# Patient Record
Sex: Female | Born: 1970 | Race: Black or African American | Hispanic: No | Marital: Married | Smoking: Never smoker
Health system: Southern US, Community
[De-identification: ages and names within clinical notes are randomized; demographics above are authoritative.]

## PROBLEM LIST (undated history)

## (undated) DIAGNOSIS — D649 Anemia, unspecified: Secondary | ICD-10-CM

## (undated) DIAGNOSIS — M1990 Unspecified osteoarthritis, unspecified site: Secondary | ICD-10-CM

## (undated) DIAGNOSIS — M51369 Other intervertebral disc degeneration, lumbar region without mention of lumbar back pain or lower extremity pain: Secondary | ICD-10-CM

## (undated) DIAGNOSIS — K219 Gastro-esophageal reflux disease without esophagitis: Secondary | ICD-10-CM

## (undated) DIAGNOSIS — R7303 Prediabetes: Secondary | ICD-10-CM

## (undated) DIAGNOSIS — M5136 Other intervertebral disc degeneration, lumbar region: Secondary | ICD-10-CM

## (undated) HISTORY — PX: BREAST SURGERY: SHX581

## (undated) HISTORY — PX: TUBAL LIGATION: SHX77

---

## 1997-12-03 ENCOUNTER — Ambulatory Visit (HOSPITAL_COMMUNITY): Admission: RE | Admit: 1997-12-03 | Discharge: 1997-12-03 | Payer: Self-pay | Admitting: *Deleted

## 1997-12-24 ENCOUNTER — Ambulatory Visit (HOSPITAL_COMMUNITY): Admission: RE | Admit: 1997-12-24 | Discharge: 1997-12-24 | Payer: Self-pay | Admitting: *Deleted

## 1998-03-13 ENCOUNTER — Emergency Department (HOSPITAL_COMMUNITY): Admission: EM | Admit: 1998-03-13 | Discharge: 1998-03-13 | Payer: Self-pay | Admitting: Emergency Medicine

## 2000-06-05 ENCOUNTER — Other Ambulatory Visit: Admission: RE | Admit: 2000-06-05 | Discharge: 2000-06-05 | Payer: Self-pay | Admitting: Obstetrics and Gynecology

## 2002-03-27 ENCOUNTER — Other Ambulatory Visit: Admission: RE | Admit: 2002-03-27 | Discharge: 2002-03-27 | Payer: Self-pay | Admitting: Obstetrics and Gynecology

## 2002-07-09 HISTORY — PX: REDUCTION MAMMAPLASTY: SUR839

## 2002-07-24 ENCOUNTER — Observation Stay (HOSPITAL_COMMUNITY): Admission: EM | Admit: 2002-07-24 | Discharge: 2002-07-24 | Payer: Self-pay | Admitting: Plastic Surgery

## 2002-07-24 ENCOUNTER — Encounter (INDEPENDENT_AMBULATORY_CARE_PROVIDER_SITE_OTHER): Payer: Self-pay | Admitting: Specialist

## 2003-02-09 ENCOUNTER — Other Ambulatory Visit: Admission: RE | Admit: 2003-02-09 | Discharge: 2003-02-09 | Payer: Self-pay | Admitting: *Deleted

## 2003-12-03 ENCOUNTER — Emergency Department (HOSPITAL_COMMUNITY): Admission: EM | Admit: 2003-12-03 | Discharge: 2003-12-03 | Payer: Self-pay | Admitting: Emergency Medicine

## 2003-12-05 ENCOUNTER — Emergency Department (HOSPITAL_COMMUNITY): Admission: EM | Admit: 2003-12-05 | Discharge: 2003-12-05 | Payer: Self-pay | Admitting: *Deleted

## 2007-10-25 ENCOUNTER — Inpatient Hospital Stay (HOSPITAL_COMMUNITY): Admission: AD | Admit: 2007-10-25 | Discharge: 2007-10-25 | Payer: Self-pay | Admitting: Gynecology

## 2007-10-26 ENCOUNTER — Inpatient Hospital Stay (HOSPITAL_COMMUNITY): Admission: AD | Admit: 2007-10-26 | Discharge: 2007-10-26 | Payer: Self-pay | Admitting: Obstetrics & Gynecology

## 2007-10-27 ENCOUNTER — Emergency Department (HOSPITAL_COMMUNITY): Admission: EM | Admit: 2007-10-27 | Discharge: 2007-10-27 | Payer: Self-pay | Admitting: Emergency Medicine

## 2009-07-06 ENCOUNTER — Emergency Department (HOSPITAL_COMMUNITY): Admission: EM | Admit: 2009-07-06 | Discharge: 2009-07-06 | Payer: Self-pay | Admitting: Emergency Medicine

## 2010-07-30 ENCOUNTER — Encounter: Payer: Self-pay | Admitting: Gynecology

## 2010-11-24 NOTE — Op Note (Signed)
NAME:  Sandra Jenkins, Sandra Jenkins                      ACCOUNT NO.:  [id number]   MEDICAL RECORD NO.:  [id number]                   PATIENT TYPE:  AMB   LOCATION:  DSC                                  FACILITY:  [facility]   PHYSICIAN:  Brantley Persons, M.D.             DATE OF BIRTH:  1970/09/01   DATE OF PROCEDURE:  DATE OF DISCHARGE:                                 OPERATIVE REPORT   DATE OF BIRTH:  [birth year]   PREOPERATIVE DIAGNOSES:  1. Bilateral macromastia.  2. Bilateral accessory nipples.   POSTOPERATIVE DIAGNOSES:  1. Bilateral macromastia.  2. Bilateral accessory nipples.   PROCEDURE:  1. Bilateral reduction mammoplasties.  2. Excision of 2.5 cm right breast accessory nipple.  3. Excision of 4.5 cm left breast accessory nipple.   ATTENDING SURGEON:  Brantley Persons, M.D.   ANESTHESIA:  General endotracheal anesthesia.   ANESTHESIOLOGIST:  Janetta Hora. Gelene Mink, M.D.   ESTIMATED BLOOD LOSS:  225 cc.   FLUIDS REPLACED:  3500 cc crystalloid.   URINE OUTPUT:  250 cc.   COMPLICATIONS:  None.   INDICATIONS FOR PROCEDURE:  The patient is a 40 year-old African-American  female with bilateral macromastia that is becoming symptomatic.  She  complains of neck aches, upper backaches, headaches, shoulder strap groove  marks with pain and intertriginous skin rashes.  She presents to undergo  bilateral reduction mammoplasties.  Additionally, she has accessory nipples  present on both breasts.  The patient would like to have these accessory  nipples excised at the same time as the breast reduction surgery is  performed.  The indication for an overnight stay and progressive pain  control along with ambulation and monitoring of the nipples and the breast  flaps was discussed.   DESCRIPTION OF PROCEDURE:  The patient was marked in the preoperative  holding area in the pattern of a Wise for the future bilateral reduction  mammoplasties.  The areas of excision for the accessory  nipples were also  marked.  She was then taken back to the OR and placed on the table in the  supine position.  After general endotracheal anesthesia was obtained, the  patient's chest was prepped with Betadine and alcohol and draped in sterile  fashion.  The skin and subcutaneous tissues at the base of the breasts were  then injected with 1% lidocaine with epinephrine.  After adequate hemostasis  took effect the procedure was begun.  First the right breast reduction was  performed.  The nipple was marked with a 45 mm nipple marker.  The skin was  then incised around this and de-epithelialized down to the inframammary  crease and inferior pedicle pattern. Next the medial, superior and lateral  skin flaps were elevated down to the chest wall.  The excess fat and  glandular tissue were removed from the inferior pedicle.  The nipple was  then examined and found to be pink and viable.  The  accessory nipple was  also excised on the right breast.  The wound was then irrigated with saline  irrigation.  Meticulous hemostasis was obtained with the Bugbee  electrocautery.  The inferior pedicle was centralized using 3-0 Prolene  suture. A #10 Jackson-Pratt flat fully fluted drain was then placed into the  wound.  The skin flaps were brought together at the inverted T junction with  a 2-0 Prolene suture.  The incisions were then stapled for temporary  closure.   Attention was then turned to the left breast.  In a similar manner the  nipple was marked with a 45 mm nipple marker.  This was then incised and the  skin de-epithelialized around the nipple down to the inframammary crease and  an inferior pedicle pattern.  Next the medial, superior and lateral skin  flaps were elevated down to the chest wall.  The excess fat and glandular  tissue were removed from the inferior pedicle.  The nipple was then examined  and found to be pink and viable.  The accessory nipple was also excised from  the left  breast.  The wound was then irrigated with saline irrigation.  Meticulous hemostasis was obtained with the Bugbee electrocautery.  The  inferior pedicle was centralized using 3-0 Prolene suture. A #10 Jackson-  Pratt flat fully fluted drain was placed into the left breast as one had  been placed into the right breast.  The skin flaps were brought together in  the inverted T junction.  The skin incisions were then stapled for temporary  closure.   The breasts were then compared and found to have good shape and symmetry.  All the staples were removed and the incisions closed using 3-0 Monocryl on  the dermal layer followed by 4-0 Monocryl running intracuticular stitch on  the skin.  The patient was then placed in the upright position.  The future  location of the nipple areolar complexes were then marked on the breast  using the 42 mm nipple marker.  She was then placed back into the recumbent  position.   First the future nipple areolar complex on the right breast mound was  incised.  This tissue was excised full thickness.  The nipple was then  examined and found to be pink and viable and brought out into this aperture  and sewn in place using 4-0 Monocryl on the dermal layer followed by 5-0  Monocryl running intracuticular stitch on the skin.  In a likewise fashion,  the future nipple areolar complex was incised on the left breast mound.  This tissue was excised full thickness.  The nipple was examined and found  to be pink and viable and brought out into this aperture and sewn in place  using 4-0 Monocryl on the dermal layer followed by a 5-0 Monocryl running  intracuticular stitch on the skin.  The Jackson-Pratt drain was sewn in  place using 3-0 nylon suture.  The incisions were dressed with Benzoin and  Steri-Strips and the nipples additionally with Bacitracin ointment and Adaptic.  4 x 4's were then placed over the incisions.  The patient was  placed into a light postoperative  support bra.  There were no complications.  The patient tolerated the procedure well.  Final needle and sponge counts  are reported to be correct at the end of the case.  The patient was then  awakened from general anesthesia and taken to the recovery room in  stable condition.  She will remain  overnight in the RCC for progressive pain  control along with ambulation and monitoring of the nipples and breast  flaps.  Amounts of breast tissue removed:  Right breast - 708 grams; left  breast - 828 grams.                                                Brantley Persons, M.D.    MC/MEDQ  D:  07/23/2002  T:  07/23/2002  Job:  [id number]   cc:   Raynald Kemp, M.D.  [address]  [place]  [place] [address]  Fax: [phone]

## 2010-11-24 NOTE — Op Note (Signed)
NAME:  Sandra Jenkins, Sandra Jenkins                      ACCOUNT NO.:  [id number]   MEDICAL RECORD NO.:  [id number]                   PATIENT TYPE:  OBV   LOCATION:  5733                                 FACILITY:  [facility]   PHYSICIAN:  Brantley Persons, M.D.             DATE OF BIRTH:  1971/01/22   DATE OF PROCEDURE:  07/24/2002  DATE OF DISCHARGE:  07/24/2002                                 OPERATIVE REPORT   PREOPERATIVE DIAGNOSIS:  Left breast hematoma, status post bilateral  reduction mammoplasty.   POSTOPERATIVE DIAGNOSIS:  Left breast hematoma, status post bilateral  reduction mammoplasty.   PROCEDURE:  Exploration of the left breast with evacuation of left breast  hematoma and revisional left breast reduction mammoplasty.   SURGEON:  Brantley Persons, M.D.   ANESTHESIA:  General endotracheal.   ANESTHESIOLOGIST:  Sheldon Silvan, M.D.   ESTIMATED BLOOD LOSS:  150 cubic centimeters of clotted blood removed from  the breast.   FLUIDS REPLACED:  500 cubic centimeters.   COMPLICATIONS:  None.   INDICATION FOR PROCEDURE:  The patient underwent bilateral reduction  mammoplasty on 07/23/02.  Late on the evening of 07/23/02, a probable left  breast hematoma was evident on postoperative exam clinically.  She was  therefore brought to the operating room to undergo exploration of the left  breast with possible evacuation of left breast hematoma.   DESCRIPTION OF PROCEDURE:  The patient was brought in the OR and placed on  the table in supine position.  After adequate general anesthesia was  obtained, the chest was prepped with Betadine and draped in a sterile  fashion.  The left JP drain was removed.  All of the breast reduction  incisions were then opened up.  Upon doing this, it was evident that there  was a hematoma present into the left breast.  Approximately 150 cubic  centimeters of clotted blood was removed from the left breast.  Upon  examination of the breast tissue, there was a  bleeding vessel present along  the lateral aspect of the chest.  This was cauterized.  The rest of the  breast then was explored, and there were really only a few minimal oozing  sites that are normally present postoperatively with no further bleeders.  The wound was then irrigated with saline irrigation.  Meticulous hemostasis  was obtained with the Bovie electrocautery.  The wound was then watched for  approximately five to 10 minutes.  There was no further evidence of any  active bleeding present.  The wound was then once again closed.  A #10 JP  flat, ,fully-fluted drain was then placed into the wound.  The skin flaps  brought together at the inverted T junction with a 3-0 Monocryl suture in  the dermal layer.  The incisions were stapled for temporary closure.  The  incisions were then closed using 3-0 Monocryl in the dermal layer, followed  by 4-0 Monocryl running intracuticular stitch  on the skin.  The nipple was  then examined and found to be pink and viable.  It was then brought out into  its previous aperture and sewn in place using 4-0 Monocryl in the dermal  layer, followed by 5-0 Monocryl running intracuticular stitch on the skin.  The left JP drain was sewn in place using 3-0 nylon suture.  The incisions  on both were dressed with Benzoin and Steri-Strips with bacitracin ointment  and Adaptic additionally on the nipple.  Four by fours were then placed over  the incisions, and the patient was placed into a light postoperative support  bra.  There were no complications.  The patient tolerated the procedure  well.  The final needle and sponge counts were reported to be correct at the  end of the case.  The patient was then extubated and taken to the recovery  room in stable condition.  She was still to continue to remain as an  observation stay in the hospital system; however, she will remain in the  main hospital as opposed to going back to [facility] day surgery center.  Discharge is  planned for later this morning.  Although the patient was taken  back to the OR late on the evening of 07/23/02, the surgery was not completed  until the early morning hours of 07/24/02.                                               Brantley Persons, M.D.    MC/MEDQ  D:  07/24/2002  T:  07/26/2002  Job:  [id number]

## 2011-04-03 LAB — DIFFERENTIAL
Basophils Absolute: 0
Basophils Relative: 0
Eosinophils Absolute: 0.1
Eosinophils Relative: 1
Lymphocytes Relative: 33
Lymphs Abs: 2.8
Monocytes Absolute: 0.6
Monocytes Relative: 7
Neutro Abs: 5.1
Neutrophils Relative %: 59

## 2011-04-03 LAB — URINALYSIS, ROUTINE W REFLEX MICROSCOPIC
Nitrite: NEGATIVE
Specific Gravity, Urine: 1.01
pH: 6.5

## 2011-04-03 LAB — WET PREP, GENITAL
Clue Cells Wet Prep HPF POC: NONE SEEN
Trich, Wet Prep: NONE SEEN
Yeast Wet Prep HPF POC: NONE SEEN

## 2011-04-03 LAB — GC/CHLAMYDIA PROBE AMP, GENITAL
Chlamydia, DNA Probe: NEGATIVE
GC Probe Amp, Genital: NEGATIVE

## 2011-04-03 LAB — CBC
Hemoglobin: 12
MCHC: 33.5
Platelets: 264
RDW: 15.5

## 2011-04-03 LAB — COMPREHENSIVE METABOLIC PANEL WITH GFR
ALT: 15
AST: 15
Albumin: 3.5
Alkaline Phosphatase: 81
BUN: 10
CO2: 31
Calcium: 9.1
Chloride: 106
Creatinine, Ser: 0.87
GFR calc non Af Amer: >60
Glucose, Bld: 94
Potassium: 4.3
Sodium: 140
Total Bilirubin: 0.3
Total Protein: 6.8

## 2011-04-03 LAB — POCT H PYLORI SCREEN: H. PYLORI SCREEN, POC: NEGATIVE

## 2011-04-03 LAB — AMYLASE: Amylase: 177 — ABNORMAL HIGH

## 2011-04-03 LAB — POCT PREGNANCY, URINE
Operator id: 183839
Preg Test, Ur: NEGATIVE

## 2011-11-08 ENCOUNTER — Other Ambulatory Visit: Payer: Self-pay | Admitting: Obstetrics and Gynecology

## 2011-11-08 DIAGNOSIS — Z1231 Encounter for screening mammogram for malignant neoplasm of breast: Secondary | ICD-10-CM

## 2011-11-12 ENCOUNTER — Ambulatory Visit
Admission: RE | Admit: 2011-11-12 | Discharge: 2011-11-12 | Disposition: A | Discharge status: Home / Self Care | Payer: 59 | Source: Ambulatory Visit | Attending: Obstetrics and Gynecology | Admitting: Obstetrics and Gynecology

## 2011-11-12 DIAGNOSIS — Z1231 Encounter for screening mammogram for malignant neoplasm of breast: Secondary | ICD-10-CM

## 2011-12-25 ENCOUNTER — Telehealth: Payer: Self-pay | Admitting: Obstetrics and Gynecology

## 2011-12-25 NOTE — Telephone Encounter (Signed)
New pt/no cht

## 2012-01-30 ENCOUNTER — Encounter: Payer: Self-pay | Admitting: Obstetrics and Gynecology

## 2012-02-04 ENCOUNTER — Encounter: Payer: Self-pay | Admitting: Obstetrics and Gynecology

## 2012-08-22 ENCOUNTER — Encounter: Payer: Self-pay | Admitting: Obstetrics and Gynecology

## 2012-09-28 ENCOUNTER — Ambulatory Visit (INDEPENDENT_AMBULATORY_CARE_PROVIDER_SITE_OTHER): Payer: 59 | Admitting: Physician Assistant

## 2012-09-28 VITALS — BP 106/75 | HR 72 | Temp 97.9°F | Resp 15 | Ht 64.0 in | Wt 269.0 lb

## 2012-09-28 DIAGNOSIS — J069 Acute upper respiratory infection, unspecified: Secondary | ICD-10-CM

## 2012-09-28 MED ORDER — CETIRIZINE HCL 10 MG PO TABS: 10.0000 mg | ORAL_TABLET | Freq: Every day | ORAL | Status: DC

## 2012-09-28 MED ORDER — IPRATROPIUM BROMIDE 0.03 % NA SOLN: 2.0000 | Freq: Two times a day (BID) | NASAL | Status: DC

## 2012-09-28 NOTE — Patient Instructions (Addendum)
Begin taking the Zyrtec (cetirizine) once daily.  Also begin using the Atrovent nasal spray twice daily.  Both of these will help with the runny nose and sneezing.  Plenty of fluids and rest.  Please let me know if anything is worsening or not improving, of if new symptoms develop.  You can continue taking the Zyrtec daily if you feel like you are getting benefit from it   Upper Respiratory Infection, Adult An upper respiratory infection (URI) is also sometimes known as the common cold. The upper respiratory tract includes the nose, sinuses, throat, trachea, and bronchi. Bronchi are the airways leading to the lungs. Most people improve within 1 week, but symptoms can last up to 2 weeks. A residual cough may last even longer.  CAUSES Many different viruses can infect the tissues lining the upper respiratory tract. The tissues become irritated and inflamed and often become very moist. Mucus production is also common. A cold is contagious. You can easily spread the virus to others by oral contact. This includes kissing, sharing a glass, coughing, or sneezing. Touching your mouth or nose and then touching a surface, which is then touched by another person, can also spread the virus. SYMPTOMS  Symptoms typically develop 1 to 3 days after you come in contact with a cold virus. Symptoms vary from person to person. They may include:  Runny nose.  Sneezing.  Nasal congestion.  Sinus irritation.  Sore throat.  Loss of voice (laryngitis).  Cough.  Fatigue.  Muscle aches.  Loss of appetite.  Headache.  Low-grade fever. DIAGNOSIS  You might diagnose your own cold based on familiar symptoms, since most people get a cold 2 to 3 times a year. Your caregiver can confirm this based on your exam. Most importantly, your caregiver can check that your symptoms are not due to another disease such as strep throat, sinusitis, pneumonia, asthma, or epiglottitis. Blood tests, throat tests, and X-rays are  not necessary to diagnose a common cold, but they may sometimes be helpful in excluding other more serious diseases. Your caregiver will decide if any further tests are required. RISKS AND COMPLICATIONS  You may be at risk for a more severe case of the common cold if you smoke cigarettes, have chronic heart disease (such as heart failure) or lung disease (such as asthma), or if you have a weakened immune system. The very young and very old are also at risk for more serious infections. Bacterial sinusitis, middle ear infections, and bacterial pneumonia can complicate the common cold. The common cold can worsen asthma and chronic obstructive pulmonary disease (COPD). Sometimes, these complications can require emergency medical care and may be life-threatening. PREVENTION  The best way to protect against getting a cold is to practice good hygiene. Avoid oral or hand contact with people with cold symptoms. Wash your hands often if contact occurs. There is no clear evidence that vitamin C, vitamin E, echinacea, or exercise reduces the chance of developing a cold. However, it is always recommended to get plenty of rest and practice good nutrition. TREATMENT  Treatment is directed at relieving symptoms. There is no cure. Antibiotics are not effective, because the infection is caused by a virus, not by bacteria. Treatment may include:  Increased fluid intake. Sports drinks offer valuable electrolytes, sugars, and fluids.  Breathing heated mist or steam (vaporizer or shower).  Eating chicken soup or other clear broths, and maintaining good nutrition.  Getting plenty of rest.  Using gargles or lozenges for comfort.  Controlling fevers with ibuprofen or acetaminophen as directed by your caregiver.  Increasing usage of your inhaler if you have asthma. Zinc gel and zinc lozenges, taken in the first 24 hours of the common cold, can shorten the duration and lessen the severity of symptoms. Pain medicines may  help with fever, muscle aches, and throat pain. A variety of non-prescription medicines are available to treat congestion and runny nose. Your caregiver can make recommendations and may suggest nasal or lung inhalers for other symptoms.  HOME CARE INSTRUCTIONS   Only take over-the-counter or prescription medicines for pain, discomfort, or fever as directed by your caregiver.  Use a warm mist humidifier or inhale steam from a shower to increase air moisture. This may keep secretions moist and make it easier to breathe.  Drink enough water and fluids to keep your urine clear or pale yellow.  Rest as needed.  Return to work when your temperature has returned to normal or as your caregiver advises. You may need to stay home longer to avoid infecting others. You can also use a face mask and careful hand washing to prevent spread of the virus. SEEK MEDICAL CARE IF:   After the first few days, you feel you are getting worse rather than better.  You need your caregiver's advice about medicines to control symptoms.  You develop chills, worsening shortness of breath, or brown or red sputum. These may be signs of pneumonia.  You develop yellow or brown nasal discharge or pain in the face, especially when you bend forward. These may be signs of sinusitis.  You develop a fever, swollen neck glands, pain with swallowing, or white areas in the back of your throat. These may be signs of strep throat. SEEK IMMEDIATE MEDICAL CARE IF:   You have a fever.  You develop severe or persistent headache, ear pain, sinus pain, or chest pain.  You develop wheezing, a prolonged cough, cough up blood, or have a change in your usual mucus (if you have chronic lung disease).  You develop sore muscles or a stiff neck. Document Released: 12/19/2000 Document Revised: 09/17/2011 Document Reviewed: 10/27/2010 [facility] Patient Information 2013 [place], [place].

## 2012-09-28 NOTE — Progress Notes (Signed)
  Subjective:    Patient ID: Sandra Jenkins, female    DOB: 1971-06-20, 42 y.o.   MRN: [id number]  HPI   Sandra Jenkins is a very pleasant 42 yr old female here with concern for illness.  Has had about 2-3 days of nasal congestion, rhinorrhea, watery eyes, and scratchy throat.  Some non-productive cough as well.  No ear pain or HA.  No SOB or wheezing.  Chills but no fever.  No GI symptoms.  Husband was recently sick with similar symptoms.  Denies environmental allergies stating she's never been tested for this.  Has used alka seltzer for symptoms with little relief.     Review of Systems  Constitutional: Positive for chills. Negative for fever.  HENT: Positive for congestion, sore throat and rhinorrhea. Negative for ear pain and postnasal drip.   Eyes: Positive for discharge (watery).  Respiratory: Positive for cough. Negative for shortness of breath and wheezing.   Cardiovascular: Negative.   Gastrointestinal: Negative.   Musculoskeletal: Negative.   Skin: Negative.   Allergic/Immunologic: Negative for environmental allergies.  Neurological: Negative.        Objective:   Physical Exam  Vitals reviewed. Constitutional: She is oriented to person, place, and time. She appears well-developed and well-nourished. No distress.  HENT:  Head: Normocephalic and atraumatic.  Right Ear: Tympanic membrane and ear canal normal.  Left Ear: Tympanic membrane and ear canal normal.  Nose: Mucosal edema and rhinorrhea present. Right sinus exhibits no maxillary sinus tenderness and no frontal sinus tenderness. Left sinus exhibits no maxillary sinus tenderness and no frontal sinus tenderness.  Mouth/Throat: Uvula is midline, oropharynx is clear and moist and mucous membranes are normal.  Eyes: Conjunctivae are normal. No scleral icterus.  Neck: Neck supple.  Cardiovascular: Normal rate, regular rhythm and normal heart sounds.   Pulmonary/Chest: Effort normal and breath sounds normal. She has no  wheezes. She has no rales.  Lymphadenopathy:    She has no cervical adenopathy.  Neurological: She is alert and oriented to person, place, and time.  Skin: Skin is warm and dry.  Psychiatric: She has a normal mood and affect. Her behavior is normal.     Filed Vitals:   09/28/12 1012  BP: 106/75  Pulse: 72  Temp: 97.9 F (36.6 C)  Resp: 15        Assessment & Plan:  Acute upper respiratory infections of unspecified site - Plan: cetirizine (ZYRTEC) 10 MG tablet, ipratropium (ATROVENT) 0.03 % nasal spray  Sandra Jenkins is a very pleasant 42 yr old female here with 2-3 days of URI symptoms.  Nasal congestion, rhinorrhea are the most troubling symptoms.  Afebrile, VSS, lungs CTA.  Will treat symptoms with Zyrtec daily and Atrovent nasal BID.  Push fluids, plenty of rest.  If worsening or not improving, pt will RTC.

## 2013-05-06 ENCOUNTER — Other Ambulatory Visit: Payer: Self-pay

## 2013-05-06 DIAGNOSIS — Z1231 Encounter for screening mammogram for malignant neoplasm of breast: Secondary | ICD-10-CM

## 2013-06-19 ENCOUNTER — Ambulatory Visit: Payer: 59

## 2013-06-22 ENCOUNTER — Ambulatory Visit (INDEPENDENT_AMBULATORY_CARE_PROVIDER_SITE_OTHER): Payer: 59 | Admitting: Family Medicine

## 2013-06-22 VITALS — BP 108/80 | HR 109 | Temp 100.2°F | Resp 16 | Ht 64.0 in | Wt 235.0 lb

## 2013-06-22 DIAGNOSIS — J029 Acute pharyngitis, unspecified: Secondary | ICD-10-CM

## 2013-06-22 DIAGNOSIS — J020 Streptococcal pharyngitis: Secondary | ICD-10-CM

## 2013-06-22 DIAGNOSIS — R509 Fever, unspecified: Secondary | ICD-10-CM

## 2013-06-22 LAB — POCT INFLUENZA A/B
Influenza A, POC: NEGATIVE
Influenza B, POC: NEGATIVE

## 2013-06-22 MED ORDER — HYDROCODONE-ACETAMINOPHEN 7.5-325 MG/15ML PO SOLN: 10.0000 mL | Freq: Four times a day (QID) | ORAL | Status: DC | PRN

## 2013-06-22 MED ORDER — AMOXICILLIN 875 MG PO TABS: 875.0000 mg | ORAL_TABLET | Freq: Two times a day (BID) | ORAL | Status: DC

## 2013-06-22 MED ORDER — FIRST-DUKES MOUTHWASH MT SUSP: 5.0000 mL | OROMUCOSAL | Status: DC | PRN

## 2013-06-22 NOTE — Progress Notes (Addendum)
This chart was scribed for Norberto Sorenson, MD by Joaquin Music, ED Scribe. This patient was seen in room Room/bed 13 and the patient's care was started at 10:02 AM. Subjective:    Patient ID: Sandra Jenkins, female    DOB: 04-01-1971, 42 y.o.   MRN: [id number] Chief Complaint  Patient presents with  . Sore Throat    all symptoms since Friday  . Cough  . Chills    HPI Sandra Jenkins is a 42 y.o. female who presents to the [facility] complaining of ongoing worsening sore throat, cough, subj fever, chills, Rt ear pain, HA, myalgias and voice change that began 3 days ago. Pt reports having cough prod of green/yellow sputum and same when blowing her nose. She states she has taken Tylenol Cold, BC powders, and throat lozenges w/o relief and denies taking any medications today. She states her god-daughter has strep throat currently but denies any other sick contacts. No GI/GU sxs.   History reviewed. No pertinent past medical history.  No Known Allergies  Current outpatient prescriptions:Phenylephrine-DM-GG-APAP (TYLENOL COLD/FLU SEVERE PO), Take by mouth as needed., Disp: , Rfl: ;  cetirizine (ZYRTEC) 10 MG tablet, Take 1 tablet (10 mg total) by mouth daily., Disp: 30 tablet, Rfl: 11;  ipratropium (ATROVENT) 0.03 % nasal spray, Place 2 sprays into the nose 2 (two) times daily., Disp: 30 mL, Rfl: 1  Review of Systems  Constitutional: Positive for fever, chills, diaphoresis, activity change, appetite change and fatigue. Negative for unexpected weight change.  HENT: Positive for congestion, ear pain, postnasal drip, rhinorrhea, sore throat, trouble swallowing and voice change.   Respiratory: Positive for cough. Negative for choking, shortness of breath, wheezing and stridor.   Cardiovascular: Negative for chest pain.  Gastrointestinal: Negative for nausea and diarrhea.  Musculoskeletal: Positive for myalgias.  Skin: Negative for rash.  Neurological: Positive for headaches.  Hematological:  Positive for adenopathy.  Psychiatric/Behavioral: Positive for sleep disturbance.    Triage Vitals:BP 108/80  Pulse 109  Temp(Src) 100.2 F (37.9 C) (Oral)  Resp 16  Ht 5\' 4"  (1.626 m)  Wt 235 lb (106.595 kg)  BMI 40.32 kg/m2  SpO2 95%  LMP 06/01/2013 Objective:   Physical Exam  Nursing note and vitals reviewed. Constitutional: She is oriented to person, place, and time. She appears well-developed and well-nourished. No distress.  HENT:  Head: Normocephalic and atraumatic.  Right Ear: External ear normal.  Left Ear: Tympanic membrane, external ear and ear canal normal.  Nose: Mucosal edema and rhinorrhea present.  Mouth/Throat: Uvula is midline. Mucous membranes are dry. No trismus in the jaw. No uvula swelling. Oropharyngeal exudate, posterior oropharyngeal edema and posterior oropharyngeal erythema present. No tonsillar abscesses.  R ear occluded with cerumen. Nasal mucosal edema and rhinorrhea Rt worse then Lt. Tonsils at 3+ bilaterally. Bright beet red erythema.    Eyes: Pupils are equal, round, and reactive to light.  Neck: Normal range of motion. Neck supple. No rigidity. Normal range of motion present. No mass and no thyromegaly present.  Cardiovascular: Normal rate and regular rhythm.   Pulmonary/Chest: Effort normal. No respiratory distress. She has no decreased breath sounds. She has no wheezes. She has no rales.  Bibasilar expiratory rhonchi that cleared with repeated inhalations.   Musculoskeletal: Normal range of motion.  Lymphadenopathy:       Head (right side): Submandibular and tonsillar adenopathy present. No preauricular and no posterior auricular adenopathy present.       Head (left side): Submandibular and tonsillar adenopathy present.  No preauricular and no posterior auricular adenopathy present.       Right cervical: No posterior cervical adenopathy present.      Left cervical: No superficial cervical and no posterior cervical adenopathy present.       Right:  No supraclavicular adenopathy present.       Left: No supraclavicular adenopathy present.  Neurological: She is alert and oriented to person, place, and time.  Skin: Skin is warm and dry.  Psychiatric: She has a normal mood and affect. Her behavior is normal.   Results for orders placed in visit on 06/22/13  POCT RAPID STREP A (OFFICE)      Result Value Range   Rapid Strep A Screen Positive (*) Negative  POCT INFLUENZA A/B      Result Value Range   Influenza A, POC Negative     Influenza B, POC Negative     Assessment & Plan:  Streptococcal sore throat - warned of complications if sxs worsen at all - pt does have mild "hot potato voice" on exam today so if worsening at all, if any trouble swallowing medication, RTC immed - pt understands and is agreeable.  Sore throat - Plan: POCT rapid strep A, POCT Influenza A/B  Fever - Plan: POCT Influenza A/B  Meds ordered this encounter  Medications  . DISCONTD: Phenylephrine-DM-GG-APAP (TYLENOL COLD/FLU SEVERE PO)    Sig: Take by mouth as needed.  Marland Kitchen HYDROcodone-acetaminophen (HYCET) 7.5-325 mg/15 ml solution    Sig: Take 10-15 mLs by mouth every 6 (six) hours as needed.    Dispense:  140 mL    Refill:  0  . Diphenhyd-Hydrocort-Nystatin (FIRST-DUKES MOUTHWASH) SUSP    Sig: Use as directed 5 mLs in the mouth or throat every 2 (two) hours as needed (sore throat). Mix 2:1 with dukes:lidocaine    Dispense:  237 mL    Refill:  0  . amoxicillin (AMOXIL) 875 MG tablet    Sig: Take 1 tablet (875 mg total) by mouth 2 (two) times daily.    Dispense:  20 tablet    Refill:  0    I personally performed the services described in this documentation, which was scribed in my presence. The recorded information has been reviewed and considered, and addended by me as needed.  Norberto Sorenson, MD MPH

## 2013-06-22 NOTE — Patient Instructions (Signed)
Strep Throat  Strep throat is an infection of the throat caused by a bacteria named Streptococcus pyogenes. Your caregiver may call the infection streptococcal "tonsillitis" or "pharyngitis" depending on whether there are signs of inflammation in the tonsils or back of the throat. Strep throat is most common in children aged 43 15 years during the cold months of the year, but it can occur in people of any age during any season. This infection is spread from person to person (contagious) through coughing, sneezing, or other close contact.  SYMPTOMS   · Fever or chills.  · Painful, swollen, red tonsils or throat.  · Pain or difficulty when swallowing.  · White or yellow spots on the tonsils or throat.  · Swollen, tender lymph nodes or "glands" of the neck or under the jaw.  · Red rash all over the body (rare).  DIAGNOSIS   Many different infections can cause the same symptoms. A test must be done to confirm the diagnosis so the right treatment can be given. A "rapid strep test" can help your caregiver make the diagnosis in a few minutes. If this test is not available, a light swab of the infected area can be used for a throat culture test. If a throat culture test is done, results are usually available in a day or two.  TREATMENT   Strep throat is treated with antibiotic medicine.  HOME CARE INSTRUCTIONS   · Gargle with 1 tsp of salt in 1 cup of warm water, 3 4 times per day or as needed for comfort.  · Family members who also have a sore throat or fever should be tested for strep throat and treated with antibiotics if they have the strep infection.  · Make sure everyone in your household washes their hands well.  · Do not share food, drinking cups, or personal items that could cause the infection to spread to others.  · You may need to eat a soft food diet until your sore throat gets better.  · Drink enough water and fluids to keep your urine clear or pale yellow. This will help prevent dehydration.  · Get plenty of  rest.  · Stay home from school, daycare, or work until you have been on antibiotics for 24 hours.  · Only take over-the-counter or prescription medicines for pain, discomfort, or fever as directed by your caregiver.  · If antibiotics are prescribed, take them as directed. Finish them even if you start to feel better.  SEEK MEDICAL CARE IF:   · The glands in your neck continue to enlarge.  · You develop a rash, cough, or earache.  · You cough up green, yellow-brown, or bloody sputum.  · You have pain or discomfort not controlled by medicines.  · Your problems seem to be getting worse rather than better.  SEEK IMMEDIATE MEDICAL CARE IF:   · You develop any new symptoms such as vomiting, severe headache, stiff or painful neck, chest pain, shortness of breath, or trouble swallowing.  · You develop severe throat pain, drooling, or changes in your voice.  · You develop swelling of the neck, or the skin on the neck becomes red and tender.  · You have a fever.  · You develop signs of dehydration, such as fatigue, dry mouth, and decreased urination.  · You become increasingly sleepy, or you cannot wake up completely.  Document Released: 06/22/2000 Document Revised: 06/11/2012 Document Reviewed: 08/24/2010  Exit[employer]® Patient Information ©2014 [employer].

## 2013-08-07 ENCOUNTER — Other Ambulatory Visit (INDEPENDENT_AMBULATORY_CARE_PROVIDER_SITE_OTHER): Payer: Self-pay | Admitting: General Surgery

## 2013-08-07 ENCOUNTER — Encounter (INDEPENDENT_AMBULATORY_CARE_PROVIDER_SITE_OTHER): Payer: Self-pay | Admitting: General Surgery

## 2013-08-07 ENCOUNTER — Ambulatory Visit (INDEPENDENT_AMBULATORY_CARE_PROVIDER_SITE_OTHER): Payer: 59 | Admitting: General Surgery

## 2013-08-07 DIAGNOSIS — Z6841 Body Mass Index (BMI) 40.0 and over, adult: Secondary | ICD-10-CM

## 2013-08-07 DIAGNOSIS — M2550 Pain in unspecified joint: Secondary | ICD-10-CM

## 2013-08-07 NOTE — Progress Notes (Signed)
Subjective:   obesity  Patient ID: Sandra Jenkins, female   DOB: February 12, 1971, 43 y.o.   MRN: [id number]  HPI Patient is a pleasant 43 year old female referred by Dr. Delman Cheadle for consideration for surgical treatment for morbid obesity. The patient states that she was relatively normal weight throughout young adulthood but once she began to have her children she experienced progressive weight gain after pregnancies. She has been through multiple efforts at diet and exercise and medical treatment for her obesity and has been able to lose up to 40 pounds at a time particularly with medications but then experiences progressive weight gain. She is having significant pain in her knees and has been told she will need surgery on her right knee but that it would be difficult that her current weight. She finds it difficult to do any sort of exercise or participate in her routine activities due to her joint pain. She has avoided any other serious illness today but is concerned about her long-term health going forward and her current weight.  History reviewed. No pertinent past medical history. Past Surgical History  Procedure Laterality Date  . Breast surgery    . Cesarean section    . Tubal ligation     Current Outpatient Prescriptions  Medication Sig Dispense Refill  . amoxicillin (AMOXIL) 875 MG tablet Take 1 tablet (875 mg total) by mouth 2 (two) times daily.  20 tablet  0  . cetirizine (ZYRTEC) 10 MG tablet Take 1 tablet (10 mg total) by mouth daily.  30 tablet  11  . Diphenhyd-Hydrocort-Nystatin (FIRST-DUKES MOUTHWASH) SUSP Use as directed 5 mLs in the mouth or throat every 2 (two) hours as needed (sore throat). Mix 2:1 with dukes:lidocaine  237 mL  0  . HYDROcodone-acetaminophen (HYCET) 7.5-325 mg/15 ml solution Take 10-15 mLs by mouth every 6 (six) hours as needed.  140 mL  0  . ipratropium (ATROVENT) 0.03 % nasal spray Place 2 sprays into the nose 2 (two) times daily.  30 mL  1   No current  facility-administered medications for this visit.   No Known Allergies History  Substance Use Topics  . Smoking status: Never Smoker   . Smokeless tobacco: Never Used  . Alcohol Use: Yes     Comment: 2/month     Review of Systems General: No fever chills malaise HEENT: The vision hearing swallowing problems Respiratory denies cough or shortness of breath or wheezing Cardiac denies chest pain palpitations or leg swelling Abdomen: Mild occasional reflux controlled with over-the-counter medications. Some tendency toward constipation. Breast: Mammogram up-to-date no masses GU: History of heavy menses and anemia no urinary difficulties Extremities: Chronic knee pain    Objective:   Physical Exam BP 131/77  Pulse 83  Temp(Src) 97.4 F (36.3 C) (Temporal)  Resp 16  Ht 5\' 4"  (1.626 m)  Wt 274 lb 6.4 oz (124.467 kg)  BMI 47.08 kg/m2 General: Alert, obese African American female, in no distress Skin: Warm and dry without rash or infection. HEENT: No palpable masses or thyromegaly. Sclera nonicteric. Pupils equal round and reactive. Oropharynx clear. Lymph nodes: No cervical, supraclavicular, or inguinal nodes palpable. Lungs: Breath sounds clear and equal without increased work of breathing Cardiovascular: Regular rate and rhythm without murmur. No JVD or edema. Peripheral pulses intact. Abdomen: Nondistended. Soft and nontender. No masses palpable. No organomegaly. No palpable hernias. Extremities: No edema or joint swelling or deformity. No chronic venous stasis changes. Neurologic: Alert and fully oriented. Gait normal.  Assessment:     43 year old female with progressive morbid obesity unresponsive to multiple attempts at medical management who presents at a BMI of 47 and comorbidities of chronic joint pain. The patient has significant medical benefit if we get her down to a healthier weight. We reviewed in detail the surgical options available including lap band, sleeve  gastrectomy and gastric bypass. She would prefer the sleeve gastrectomy due to the reliability of weight loss and less complex nature of the surgery. I think this would be a good choice for her. We reviewed the nature of the surgery and recovery and detail. We discussed risks including anesthetic complications, bleeding, leakage and infection, pulmonary embolus and risk of death. We discussed long-term problems including reflux, stenosis, poor gastric emptying, gallstones and others. She was given a complete consent form to review. Prohibit preop psychologic and nutrition evaluation as well as upper GI series and routine blood work. He'll see her back following this workup.    Plan:     Proceed with workup for planned laparoscopic sleeve gastrectomy

## 2013-08-14 ENCOUNTER — Other Ambulatory Visit (INDEPENDENT_AMBULATORY_CARE_PROVIDER_SITE_OTHER): Payer: Self-pay

## 2013-09-01 ENCOUNTER — Ambulatory Visit (HOSPITAL_COMMUNITY)
Admission: RE | Admit: 2013-09-01 | Discharge: 2013-09-01 | Disposition: A | Discharge status: Home / Self Care | Payer: 59 | Source: Ambulatory Visit | Attending: General Surgery | Admitting: General Surgery

## 2013-09-01 ENCOUNTER — Other Ambulatory Visit: Payer: Self-pay

## 2013-09-01 DIAGNOSIS — Z6841 Body Mass Index (BMI) 40.0 and over, adult: Secondary | ICD-10-CM | POA: Insufficient documentation

## 2013-09-01 DIAGNOSIS — K449 Diaphragmatic hernia without obstruction or gangrene: Secondary | ICD-10-CM | POA: Insufficient documentation

## 2013-09-01 DIAGNOSIS — G8929 Other chronic pain: Secondary | ICD-10-CM | POA: Insufficient documentation

## 2013-09-01 DIAGNOSIS — M2550 Pain in unspecified joint: Secondary | ICD-10-CM | POA: Insufficient documentation

## 2013-09-28 ENCOUNTER — Encounter: Payer: 59 | Attending: General Surgery | Admitting: Dietician

## 2013-09-28 ENCOUNTER — Encounter: Payer: Self-pay | Admitting: Dietician

## 2013-09-28 DIAGNOSIS — Z713 Dietary counseling and surveillance: Secondary | ICD-10-CM | POA: Insufficient documentation

## 2013-09-28 NOTE — Progress Notes (Signed)
  Pre-Op Assessment Visit:  Pre-Operative Gastric Sleeve Surgery  Medical Nutrition Therapy:  Appt start time: 8592   End time:  0845.  Patient was seen on 09/28/2013 for Pre-Operative Gastric sleeve Nutrition Assessment. Assessment and letter of approval faxed to [facility] coordinator on 09/28/2013.   Preferred Learning Style:   No preference indicated   Learning Readiness:   Contemplating  Samples provided:  Premier protein shake (strawberry) Lot#: [id number] Exp: 06/2014  Handouts given during visit include:  Pre-Op Goals Bariatric Surgery Protein Shakes  Teaching Method Utilized: Visual Auditory Hands on  Barriers to learning/adherence to lifestyle change: none  Demonstrated degree of understanding via:  Teach Back   Patient to call the [facility] to enroll in Pre-Op and Post-Op Nutrition Education when surgery date is scheduled.

## 2013-09-28 NOTE — Patient Instructions (Signed)
Patient to call the [facility] to enroll in Pre-Op and Post-Op Nutrition Education when surgery date is scheduled. 

## 2013-09-29 LAB — LIPID PANEL
CHOLESTEROL: 134 mg/dL (ref 0–200)
HDL: 49 mg/dL (ref >39)
LDL Cholesterol: 66 mg/dL (ref 0–99)
Total CHOL/HDL Ratio: 2.7 Ratio
Triglycerides: 95 mg/dL (ref <150)
VLDL: 19 mg/dL (ref 0–40)

## 2013-09-29 LAB — CBC WITH DIFFERENTIAL/PLATELET
Basophils Absolute: 0 10*3/uL (ref 0.0–0.1)
Basophils Relative: 0 % (ref 0–1)
EOS PCT: 1 % (ref 0–5)
Eosinophils Absolute: 0.1 10*3/uL (ref 0.0–0.7)
HCT: 35.8 % — ABNORMAL LOW (ref 36.0–46.0)
Hemoglobin: 11.7 g/dL — ABNORMAL LOW (ref 12.0–15.0)
LYMPHS ABS: 3 10*3/uL (ref 0.7–4.0)
LYMPHS PCT: 36 % (ref 12–46)
MCH: 25.4 pg — ABNORMAL LOW (ref 26.0–34.0)
MCHC: 32.7 g/dL (ref 30.0–36.0)
MCV: 77.7 fL — AB (ref 78.0–100.0)
Monocytes Absolute: 0.5 10*3/uL (ref 0.1–1.0)
Monocytes Relative: 6 % (ref 3–12)
NEUTROS ABS: 4.8 10*3/uL (ref 1.7–7.7)
Neutrophils Relative %: 57 % (ref 43–77)
PLATELETS: 314 10*3/uL (ref 150–400)
RBC: 4.61 MIL/uL (ref 3.87–5.11)
RDW: 16 % — ABNORMAL HIGH (ref 11.5–15.5)
WBC: 8.4 10*3/uL (ref 4.0–10.5)

## 2013-09-29 LAB — TSH: TSH: 2.765 u[IU]/mL (ref 0.350–4.500)

## 2013-09-29 LAB — T4: T4 TOTAL: 9.3 ug/dL (ref 5.0–12.5)

## 2013-09-29 LAB — HCG, SERUM, QUALITATIVE: PREG SERUM: NEGATIVE

## 2013-10-26 ENCOUNTER — Ambulatory Visit: Payer: 59 | Admitting: Dietician

## 2013-10-30 ENCOUNTER — Ambulatory Visit: Payer: 59 | Admitting: Dietician

## 2013-11-23 ENCOUNTER — Ambulatory Visit: Payer: 59 | Admitting: Dietician

## 2013-11-24 ENCOUNTER — Ambulatory Visit: Payer: 59 | Admitting: Dietician

## 2013-12-11 ENCOUNTER — Ambulatory Visit: Payer: 59

## 2013-12-15 ENCOUNTER — Ambulatory Visit: Payer: 59 | Admitting: Dietician

## 2013-12-21 ENCOUNTER — Ambulatory Visit: Payer: 59 | Admitting: Dietician

## 2014-01-13 ENCOUNTER — Ambulatory Visit: Payer: 59 | Admitting: Dietician

## 2014-01-18 ENCOUNTER — Ambulatory Visit: Payer: 59 | Admitting: Dietician

## 2014-02-15 ENCOUNTER — Ambulatory Visit: Payer: 59 | Admitting: Dietician

## 2014-02-18 ENCOUNTER — Ambulatory Visit: Payer: 59 | Admitting: Dietician

## 2014-03-16 ENCOUNTER — Ambulatory Visit: Payer: 59 | Admitting: Dietician

## 2014-03-17 ENCOUNTER — Ambulatory Visit: Payer: 59 | Admitting: Dietician

## 2014-03-22 ENCOUNTER — Other Ambulatory Visit: Payer: Self-pay | Admitting: Obstetrics & Gynecology

## 2014-03-24 ENCOUNTER — Encounter (HOSPITAL_COMMUNITY): Payer: Self-pay | Admitting: Pharmacy Technician

## 2014-03-24 ENCOUNTER — Encounter (HOSPITAL_COMMUNITY): Payer: Self-pay

## 2014-03-24 ENCOUNTER — Encounter (HOSPITAL_COMMUNITY)
Admission: RE | Admit: 2014-03-24 | Discharge: 2014-03-24 | Disposition: A | Discharge status: Home / Self Care | Payer: 59 | Source: Ambulatory Visit | Attending: Obstetrics & Gynecology | Admitting: Obstetrics & Gynecology

## 2014-03-24 DIAGNOSIS — D251 Intramural leiomyoma of uterus: Secondary | ICD-10-CM | POA: Diagnosis not present

## 2014-03-24 DIAGNOSIS — N926 Irregular menstruation, unspecified: Secondary | ICD-10-CM | POA: Diagnosis present

## 2014-03-24 DIAGNOSIS — N949 Unspecified condition associated with female genital organs and menstrual cycle: Secondary | ICD-10-CM | POA: Diagnosis not present

## 2014-03-24 DIAGNOSIS — N840 Polyp of corpus uteri: Secondary | ICD-10-CM | POA: Diagnosis not present

## 2014-03-24 DIAGNOSIS — Z6841 Body Mass Index (BMI) 40.0 and over, adult: Secondary | ICD-10-CM | POA: Diagnosis not present

## 2014-03-24 DIAGNOSIS — N938 Other specified abnormal uterine and vaginal bleeding: Secondary | ICD-10-CM | POA: Diagnosis not present

## 2014-03-24 HISTORY — DX: Unspecified osteoarthritis, unspecified site: M19.90

## 2014-03-24 HISTORY — DX: Anemia, unspecified: D64.9

## 2014-03-24 LAB — CBC
HCT: 34.9 % — ABNORMAL LOW (ref 36.0–46.0)
HEMOGLOBIN: 11.5 g/dL — AB (ref 12.0–15.0)
MCH: 26 pg (ref 26.0–34.0)
MCHC: 33 g/dL (ref 30.0–36.0)
MCV: 79 fL (ref 78.0–100.0)
PLATELETS: 305 10*3/uL (ref 150–400)
RBC: 4.42 MIL/uL (ref 3.87–5.11)
RDW: 14.9 % (ref 11.5–15.5)
WBC: 8 10*3/uL (ref 4.0–10.5)

## 2014-03-24 LAB — SURGICAL PCR SCREEN
MRSA, PCR: NEGATIVE
STAPHYLOCOCCUS AUREUS: NEGATIVE

## 2014-03-24 NOTE — Patient Instructions (Signed)
Your procedure is scheduled on:  Thursday, March 25, 2014  Enter through the [facility] at: 12 noon  Pick up the phone at the desk and dial [phone].  Call this number if you have problems the morning of surgery: [phone].  Remember: Do NOT eat food: AFTER MIDNIGHT TONIGHT Do NOT drink clear liquids after: AFTER 9:30 AM MORNING OF SURGERY Take these medicines the morning of surgery with a SIP OF WATER: NONE *BRING ASTHMA INHALER DAY OF SURGERY  Do NOT wear jewelry (body piercing), metal hair clips/bobby pins, make-up, or nail polish. Do NOT wear lotions, powders, or perfumes.  You may wear deoderant. Do NOT shave for 48 hours prior to surgery. Do NOT bring valuables to the hospital. Contacts, dentures, or bridgework may not be worn into surgery.  Have a responsible adult drive you home and stay with you for 24 hours after your procedure

## 2014-03-24 NOTE — H&P (Signed)
Sandra Jenkins is an 43 y.o. female. G2 P2 LMP 02/24/14 with heavy irregular periods. Pelvic Ultrasound 7/30: Uterus 9.3 x 5.3 x 6.1 cm. 1.3 cm intramural fibroid. Thickened heterogenous 36mm endometrium concerning for pathology. Endometrial biopsy in office: endometrial polyp.  Patient is for a Dilation and [place] Hysteroscopy, possible myosure.   Mammogram 2013; normal Pap 07/201: neg CBC 7/13: Hgb 12 HCT 12 TSH 2.05   Pertinent Gynecological History: Menses: flow is excessive with use of many pads or tampons on heaviest days Bleeding: intermenstrual bleeding Contraception: tubal ligation DES exposure: unknown Blood transfusions: none Sexually transmitted diseases: no past history Previous GYN Procedures: endometrial biopsy  Last mammogram: normal Date: 2013 Last pap: normal Date: 02/2014 OB History: G2, P2   Menstrual History: Menarche age: unknown  No LMP recorded.    Past Medical History  Diagnosis Date  . Arthritis     BILATERAL KNEES  . Anemia     Past Surgical History  Procedure Laterality Date  . Breast surgery    . Cesarean section    . Tubal ligation      Family History  Problem Relation Age of Onset  . Heart disease Maternal Grandmother   . Heart attack Maternal Grandmother   . Heart disease Maternal Grandfather   . Heart attack Maternal Grandfather   . Heart disease Paternal Grandmother   . Heart attack Paternal Grandmother     Social History:  reports that she has never smoked. She has never used smokeless tobacco. She reports that she drinks alcohol. She reports that she does not use illicit drugs.  Allergies: No Known Allergies  No prescriptions prior to admission    Review of Systems  Constitutional: Negative.   HENT: Negative.   Respiratory: Negative.   Cardiovascular: Negative.   Gastrointestinal: Negative.   Genitourinary: Negative for dysuria, urgency, frequency, hematuria and flank pain.       Heavy irregular periods.     Musculoskeletal: Positive for myalgias.  Neurological: Negative.   Endo/Heme/Allergies: Negative.     There were no vitals taken for this visit. Physical Exam  Constitutional: She is oriented to person, place, and time. She appears well-developed.  HENT:  Head: Normocephalic and atraumatic.  Cardiovascular: Normal rate, regular rhythm and normal heart sounds.   Respiratory: Breath sounds normal.  GI: Soft. Bowel sounds are normal. She exhibits no distension and no mass. There is no tenderness. There is no rebound and no guarding.  Genitourinary: Vagina normal and uterus normal. Pelvic exam was performed with patient supine. There is no rash, tenderness, lesion or injury on the right labia. There is no rash, tenderness, lesion or injury on the left labia. No erythema, tenderness or bleeding around the vagina. No foreign body around the vagina. No signs of injury around the vagina. No vaginal discharge found.  Musculoskeletal: Normal range of motion.  Neurological: She is alert and oriented to person, place, and time.    Results for orders placed during the hospital encounter of 03/24/14 (from the past 24 hour(s))  SURGICAL PCR SCREEN     Status: None   Collection Time    03/24/14  9:20 AM      Result Value Ref Range   MRSA, PCR NEGATIVE  NEGATIVE   Staphylococcus aureus NEGATIVE  NEGATIVE  CBC     Status: Abnormal   Collection Time    03/24/14  9:25 AM      Result Value Ref Range   WBC 8.0  4.0 - 10.5  K/uL   RBC 4.42  3.87 - 5.11 MIL/uL   Hemoglobin 11.5 (*) 12.0 - 15.0 g/dL   HCT 34.9 (*) 36.0 - 46.0 %   MCV 79.0  78.0 - 100.0 fL   MCH 26.0  26.0 - 34.0 pg   MCHC 33.0  30.0 - 36.0 g/dL   RDW 14.9  11.5 - 15.5 %   Platelets 305  150 - 400 K/uL    No results found.  Assessment/Plan: Patient with abnormal uterine bleeding, for D & C hysteroscopy, possible myosure.   [facility] 03/24/2014, 10:51 PM

## 2014-03-25 ENCOUNTER — Encounter (HOSPITAL_COMMUNITY): Payer: Self-pay | Admitting: Certified Registered"

## 2014-03-25 ENCOUNTER — Ambulatory Visit (HOSPITAL_COMMUNITY): Payer: 59 | Admitting: Certified Registered"

## 2014-03-25 ENCOUNTER — Encounter (HOSPITAL_COMMUNITY)
Admission: RE | Disposition: A | Discharge status: Home / Self Care | Payer: Self-pay | Source: Ambulatory Visit | Attending: Obstetrics & Gynecology

## 2014-03-25 ENCOUNTER — Encounter (HOSPITAL_COMMUNITY): Payer: 59 | Admitting: Certified Registered"

## 2014-03-25 ENCOUNTER — Ambulatory Visit (HOSPITAL_COMMUNITY)
Admission: RE | Admit: 2014-03-25 | Discharge: 2014-03-25 | Disposition: A | Discharge status: Home / Self Care | Payer: 59 | Source: Ambulatory Visit | Attending: Obstetrics & Gynecology | Admitting: Obstetrics & Gynecology

## 2014-03-25 DIAGNOSIS — N949 Unspecified condition associated with female genital organs and menstrual cycle: Secondary | ICD-10-CM | POA: Insufficient documentation

## 2014-03-25 DIAGNOSIS — N925 Other specified irregular menstruation: Secondary | ICD-10-CM | POA: Diagnosis not present

## 2014-03-25 DIAGNOSIS — D251 Intramural leiomyoma of uterus: Secondary | ICD-10-CM | POA: Insufficient documentation

## 2014-03-25 DIAGNOSIS — N926 Irregular menstruation, unspecified: Secondary | ICD-10-CM | POA: Insufficient documentation

## 2014-03-25 DIAGNOSIS — N938 Other specified abnormal uterine and vaginal bleeding: Secondary | ICD-10-CM | POA: Diagnosis not present

## 2014-03-25 DIAGNOSIS — N840 Polyp of corpus uteri: Secondary | ICD-10-CM | POA: Insufficient documentation

## 2014-03-25 DIAGNOSIS — Z6841 Body Mass Index (BMI) 40.0 and over, adult: Secondary | ICD-10-CM | POA: Insufficient documentation

## 2014-03-25 LAB — PREGNANCY, URINE: PREG TEST UR: NEGATIVE

## 2014-03-25 SURGERY — DILATATION & CURETTAGE/HYSTEROSCOPY WITH MYOSURE
Anesthesia: General | Site: Vagina

## 2014-03-25 MED ORDER — KETOROLAC TROMETHAMINE 30 MG/ML IJ SOLN
INTRAMUSCULAR | Status: DC | PRN
  Administered 2014-03-25: 30 mg via INTRAVENOUS

## 2014-03-25 MED ORDER — LIDOCAINE HCL (CARDIAC) 20 MG/ML IV SOLN
INTRAVENOUS | Status: AC
  Filled 2014-03-25: qty 5

## 2014-03-25 MED ORDER — MEPERIDINE HCL 25 MG/ML IJ SOLN: 6.2500 mg | INTRAMUSCULAR | Status: DC | PRN

## 2014-03-25 MED ORDER — SODIUM CHLORIDE 0.9 % IR SOLN
Status: DC | PRN
  Administered 2014-03-25: 3000 mL

## 2014-03-25 MED ORDER — FENTANYL CITRATE 0.05 MG/ML IJ SOLN
INTRAMUSCULAR | Status: DC | PRN
  Administered 2014-03-25 (×2): 50 ug via INTRAVENOUS

## 2014-03-25 MED ORDER — MIDAZOLAM HCL 2 MG/2ML IJ SOLN
INTRAMUSCULAR | Status: AC
  Filled 2014-03-25: qty 2

## 2014-03-25 MED ORDER — FENTANYL CITRATE 0.05 MG/ML IJ SOLN
INTRAMUSCULAR | Status: AC
  Filled 2014-03-25: qty 2

## 2014-03-25 MED ORDER — ONDANSETRON HCL 4 MG/2ML IJ SOLN: 4.0000 mg | Freq: Once | INTRAMUSCULAR | Status: DC | PRN

## 2014-03-25 MED ORDER — KETOROLAC TROMETHAMINE 30 MG/ML IJ SOLN
INTRAMUSCULAR | Status: AC
  Filled 2014-03-25: qty 1

## 2014-03-25 MED ORDER — LACTATED RINGERS IV SOLN
INTRAVENOUS | Status: DC
  Administered 2014-03-25 (×2): via INTRAVENOUS

## 2014-03-25 MED ORDER — MIDAZOLAM HCL 2 MG/2ML IJ SOLN
INTRAMUSCULAR | Status: DC | PRN
  Administered 2014-03-25: 2 mg via INTRAVENOUS

## 2014-03-25 MED ORDER — SCOPOLAMINE 1 MG/3DAYS TD PT72
MEDICATED_PATCH | TRANSDERMAL | Status: AC
  Filled 2014-03-25: qty 1

## 2014-03-25 MED ORDER — ONDANSETRON HCL 4 MG/2ML IJ SOLN
INTRAMUSCULAR | Status: AC
  Filled 2014-03-25: qty 2

## 2014-03-25 MED ORDER — DEXAMETHASONE SODIUM PHOSPHATE 10 MG/ML IJ SOLN
INTRAMUSCULAR | Status: DC | PRN
  Administered 2014-03-25: 4 mg via INTRAVENOUS

## 2014-03-25 MED ORDER — IBUPROFEN 800 MG PO TABS: 800.0000 mg | ORAL_TABLET | Freq: Three times a day (TID) | ORAL | Status: DC | PRN

## 2014-03-25 MED ORDER — SCOPOLAMINE 1 MG/3DAYS TD PT72
1.0000 | MEDICATED_PATCH | Freq: Once | TRANSDERMAL | Status: DC
  Administered 2014-03-25: 1.5 mg via TRANSDERMAL

## 2014-03-25 MED ORDER — BUPIVACAINE-EPINEPHRINE (PF) 0.5% -1:200000 IJ SOLN
INTRAMUSCULAR | Status: AC
  Filled 2014-03-25: qty 30

## 2014-03-25 MED ORDER — KETOROLAC TROMETHAMINE 30 MG/ML IJ SOLN: 15.0000 mg | Freq: Once | INTRAMUSCULAR | Status: DC | PRN

## 2014-03-25 MED ORDER — FENTANYL CITRATE 0.05 MG/ML IJ SOLN
25.0000 ug | INTRAMUSCULAR | Status: DC | PRN
  Administered 2014-03-25 (×2): 25 ug via INTRAVENOUS

## 2014-03-25 MED ORDER — LIDOCAINE HCL (CARDIAC) 20 MG/ML IV SOLN
INTRAVENOUS | Status: DC | PRN
  Administered 2014-03-25: 80 mg via INTRAVENOUS

## 2014-03-25 MED ORDER — OXYCODONE-ACETAMINOPHEN 5-325 MG PO TABS
1.0000 | ORAL_TABLET | ORAL | Status: DC | PRN
Start: 2014-03-25 — End: 2014-03-25

## 2014-03-25 MED ORDER — PROPOFOL 10 MG/ML IV EMUL
INTRAVENOUS | Status: AC
  Filled 2014-03-25: qty 20

## 2014-03-25 MED ORDER — ONDANSETRON HCL 4 MG/2ML IJ SOLN
INTRAMUSCULAR | Status: DC | PRN
  Administered 2014-03-25: 4 mg via INTRAVENOUS

## 2014-03-25 MED ORDER — DEXAMETHASONE SODIUM PHOSPHATE 10 MG/ML IJ SOLN
INTRAMUSCULAR | Status: AC
  Filled 2014-03-25: qty 1

## 2014-03-25 SURGICAL SUPPLY — 23 items
CANISTER SUCT 3000ML (MISCELLANEOUS) ×2 IMPLANT
CATH ROBINSON RED A/P 16FR (CATHETERS) ×2 IMPLANT
CLOTH BEACON ORANGE TIMEOUT ST (SAFETY) ×2 IMPLANT
CONTAINER PREFILL 10% NBF 60ML (FORM) ×3 IMPLANT
DEVICE MYOSURE CLASSIC (MISCELLANEOUS) IMPLANT
DEVICE MYOSURE LITE (MISCELLANEOUS) ×1 IMPLANT
DRAPE HYSTEROSCOPY (DRAPE) ×2 IMPLANT
ELECT REM PT RETURN 9FT ADLT (ELECTROSURGICAL)
ELECTRODE REM PT RTRN 9FT ADLT (ELECTROSURGICAL) IMPLANT
GLOVE BIOGEL PI IND STRL 8.5 (GLOVE) ×1 IMPLANT
GLOVE BIOGEL PI INDICATOR 8.5 (GLOVE) ×1
GLOVE SURG SS PI 6.5 STRL IVOR (GLOVE) ×4 IMPLANT
GOWN STRL REUS W/TWL LRG LVL3 (GOWN DISPOSABLE) ×4 IMPLANT
LOOP ANGLED CUTTING 22FR (CUTTING LOOP) IMPLANT
MYOSURE XL FIBROID REM (MISCELLANEOUS)
PACK VAGINAL MINOR WOMEN LF (CUSTOM PROCEDURE TRAY) ×2 IMPLANT
PAD OB MATERNITY 4.3X12.25 (PERSONAL CARE ITEMS) ×2 IMPLANT
SEAL ROD LENS SCOPE MYOSURE (ABLATOR) ×2 IMPLANT
SET TUBING HYSTEROSCOPY 2 NDL (TUBING) IMPLANT
SYSTEM TISS REMOVAL MYSR XL RM (MISCELLANEOUS) ×1 IMPLANT
TOWEL OR 17X24 6PK STRL BLUE (TOWEL DISPOSABLE) ×4 IMPLANT
TUBE HYSTEROSCOPY W Y-CONNECT (TUBING) IMPLANT
WATER STERILE IRR 1000ML POUR (IV SOLUTION) ×2 IMPLANT

## 2014-03-25 NOTE — Transfer of Care (Signed)
Immediate Anesthesia Transfer of Care Note  Patient: Sandra Jenkins  Procedure(s) Performed: Procedure(s): DILATATION & CURETTAGE/HYSTEROSCOPY WITH MYOSURE RESECTION (N/A)  Patient Location: PACU  Anesthesia Type:General  Level of Consciousness: awake, alert  and oriented  Airway & Oxygen Therapy: Patient Spontanous Breathing and Patient connected to nasal cannula oxygen  Post-op Assessment: Report given to PACU RN, Post -op Vital signs reviewed and stable and Patient moving all extremities  Post vital signs: Reviewed and stable  Complications: No apparent anesthesia complications

## 2014-03-25 NOTE — Anesthesia Preprocedure Evaluation (Addendum)
Anesthesia Evaluation  Patient identified by MRN, date of birth, ID band Patient awake    Reviewed: Allergy & Precautions, H&P , NPO status , Patient's Chart, lab work & pertinent test results, reviewed documented beta blocker date and time   Airway Mallampati: II TM Distance: >3 FB Neck ROM: full    Dental no notable dental hx. (+) Teeth Intact   Pulmonary neg pulmonary ROS,    Pulmonary exam normal       Cardiovascular negative cardio ROS  Rate:Normal     Neuro/Psych negative neurological ROS  negative psych ROS   GI/Hepatic negative GI ROS, Neg liver ROS,   Endo/Other  Morbid obesity  Renal/GU negative Renal ROS     Musculoskeletal   Abdominal (+) + obese,   Peds  Hematology   Anesthesia Other Findings   Reproductive/Obstetrics negative OB ROS                          Anesthesia Physical Anesthesia Plan  ASA: III  Anesthesia Plan: General   Post-op Pain Management:    Induction: Intravenous  Airway Management Planned: LMA  Additional Equipment:   Intra-op Plan:   Post-operative Plan:   Informed Consent: I have reviewed the patients History and Physical, chart, labs and discussed the procedure including the risks, benefits and alternatives for the proposed anesthesia with the patient or authorized representative who has indicated his/her understanding and acceptance.     Plan Discussed with: CRNA and Surgeon  Anesthesia Plan Comments:        Anesthesia Quick Evaluation

## 2014-03-25 NOTE — Op Note (Signed)
PREOP DIAGNOSIS: Abnormal uterine bleeding  Post operative diagnosis: Same as above  Procedure: Resection of endometrial polyp using Myosure procedure.   Surgeon: Dr. Waymon Amato  Assistant: Scrub technician  Anesthesiologist: General anesthesia   complications: None  IV fluid: 1200 cc normal saline  Fluid deficit: 250 cc  EBL: 20 cc  Indications: 43 year old para 2 with a history of irregular heavy periods. Patient had an ultrasound that showed a heterogeneous endometrium that was thickened. Office biopsy showed an endometrial polyp. She was taken to the operating room for resection of this polyp using Myosure.   Procedure:  Informed consent was obtained from the patient to undergo the procedure. She was taken to the operating room and anesthesia was administered without difficulty. She was prepped and draped in the usual sterile fashion. She was straight catheterized. An exam was then performed under anesthesia revealing a small anteverted uterus and a closed cervix. There were no palpable adnexal masses.  A weighted speculum and anterior retractor was used to view the cervix. Single-tooth tenaculum was placed anteriorly on the cervix. The Myosure diagnostic scope was then placed to view the uterine cavity and 2 pink endometrial lesions polyps were noted, the larger one being posterior to the left measuring about 2 cm and a smaller one anteriorly to the right measuring about 1 cm. These lesions which appear to be polyps were resected using the Myosure LITE under direct visualization without any complications. She tolerated the procedure well. The tubal ostia were noted bilaterally at the end of the procedure.  No other lesions were noted. The instruments were then removed and the patient was awoken from anesthesia. She was cleaned and taken to recovery room in stable condition  Specimen: Endometrial lesions suspicious for endometrial polyps.

## 2014-03-25 NOTE — Discharge Instructions (Addendum)
DISCHARGE INSTRUCTIONS: D&C / D&E The following instructions have been prepared to help you care for yourself upon your return home.   Personal hygiene:  Use sanitary pads for vaginal drainage, not tampons.  Shower the day after your procedure.  NO tub baths, pools or Jacuzzis for 2-3 weeks.  Wipe front to back after using the bathroom.  Activity and limitations:  Do NOT drive or operate any equipment for 24 hours. The effects of anesthesia are still present and drowsiness may result.  Do NOT rest in bed all day.  Walking is encouraged.  Walk up and down stairs slowly.  You may resume your normal activity in one to two days or as indicated by your physician.  Sexual activity: NO intercourse for at least 2 weeks after the procedure, or as indicated by your physician.  Diet: Eat a light meal as desired this evening. You may resume your usual diet tomorrow.  Return to work: You may resume your work activities in one to two days or as indicated by your doctor.  What to expect after your surgery: Expect to have vaginal bleeding/discharge for 2-3 days and spotting for up to 10 days. It is not unusual to have soreness for up to 1-2 weeks. You may have a slight burning sensation when you urinate for the first day. Mild cramps may continue for a couple of days. You may have a regular period in 2-6 weeks.  Call your doctor for any of the following:  Excessive vaginal bleeding, saturating and changing one pad every hour.  Inability to urinate 6 hours after discharge from hospital.  Pain not relieved by pain medication.  Fever of 100.4 F or greater.  Unusual vaginal discharge or odor.   Call for an appointment:    Patients signature: ______________________  Nurses signature ________________________  Support person's signature_______________________    DO NOT TAKE ANY IBUPROFEN PRODUCTS UNTIL 9 PM TONIGHT.  YOU RECEIVED A SIMILAR MEDICATION IN THE OPERATING ROOM.

## 2014-03-25 NOTE — Brief Op Note (Signed)
03/25/2014  4:04 PM  PATIENT:  Sandra Jenkins  43 y.o. female  PRE-OPERATIVE DIAGNOSIS:  Endometrial Polyp  POST-OPERATIVE DIAGNOSIS:  Endometrial Polyp  PROCEDURE:  Procedure(s): DILATATION & CURETTAGE/HYSTEROSCOPY WITH MYOSURE RESECTION (N/A)  SURGEON:  Surgeon(s) and Role:    * Marilou Barnfield Karren Burly, MD - Primary  PHYSICIAN ASSISTANT:   ASSISTANTS: Scrub Technician   ANESTHESIA:   general  EBL:  Total I/O In: 1200 [I.V.:1200] Out: 95 [Urine:75; Blood:20]  BLOOD ADMINISTERED:none  DRAINS: none   LOCAL MEDICATIONS USED:  NONE  SPECIMEN:  Source of Specimen:  Endometrium  DISPOSITION OF SPECIMEN:  PATHOLOGY  COUNTS:  YES  TOURNIQUET:  * No tourniquets in log *  DICTATION: .Dragon Dictation  PLAN OF CARE: Discharge to home after PACU  PATIENT DISPOSITION:  PACU - hemodynamically stable.   Delay start of Pharmacological VTE agent (>24hrs) due to surgical blood loss or risk of bleeding: not applicable

## 2014-03-25 NOTE — Anesthesia Postprocedure Evaluation (Signed)
Anesthesia Post Note  Patient: Sandra Jenkins  Procedure(s) Performed: Procedure(s) (LRB): DILATATION & CURETTAGE/HYSTEROSCOPY WITH MYOSURE RESECTION (N/A)  Anesthesia type: General  Patient location: PACU  Post pain: Pain level controlled  Post assessment: Post-op Vital signs reviewed  Last Vitals:  Filed Vitals:   03/25/14 1600  BP:   Pulse: 67  Temp: 36.7 C  Resp: 11    Post vital signs: Reviewed  Level of consciousness: sedated  Complications: No apparent anesthesia complications

## 2014-06-10 ENCOUNTER — Other Ambulatory Visit: Payer: Self-pay

## 2014-06-10 DIAGNOSIS — Z1231 Encounter for screening mammogram for malignant neoplasm of breast: Secondary | ICD-10-CM

## 2014-06-30 ENCOUNTER — Ambulatory Visit
Admission: RE | Admit: 2014-06-30 | Discharge: 2014-06-30 | Disposition: A | Discharge status: Home / Self Care | Payer: 59 | Source: Ambulatory Visit

## 2014-06-30 DIAGNOSIS — Z1231 Encounter for screening mammogram for malignant neoplasm of breast: Secondary | ICD-10-CM

## 2014-07-06 ENCOUNTER — Other Ambulatory Visit: Payer: Self-pay | Admitting: Obstetrics & Gynecology

## 2014-07-06 DIAGNOSIS — R928 Other abnormal and inconclusive findings on diagnostic imaging of breast: Secondary | ICD-10-CM

## 2014-07-21 ENCOUNTER — Other Ambulatory Visit: Payer: 59

## 2014-07-21 ENCOUNTER — Inpatient Hospital Stay: Admission: RE | Admit: 2014-07-21 | Payer: 59 | Source: Ambulatory Visit

## 2014-08-10 ENCOUNTER — Other Ambulatory Visit: Payer: Self-pay

## 2014-08-18 ENCOUNTER — Other Ambulatory Visit: Payer: Self-pay

## 2014-12-28 ENCOUNTER — Emergency Department (HOSPITAL_COMMUNITY)
Admission: EM | Admit: 2014-12-28 | Discharge: 2014-12-28 | Disposition: A | Discharge status: Home / Self Care | Payer: 59 | Attending: Emergency Medicine | Admitting: Emergency Medicine

## 2014-12-28 ENCOUNTER — Encounter (HOSPITAL_COMMUNITY): Payer: Self-pay | Admitting: Nurse Practitioner

## 2014-12-28 ENCOUNTER — Emergency Department (HOSPITAL_COMMUNITY): Payer: 59

## 2014-12-28 DIAGNOSIS — Z862 Personal history of diseases of the blood and blood-forming organs and certain disorders involving the immune mechanism: Secondary | ICD-10-CM | POA: Insufficient documentation

## 2014-12-28 DIAGNOSIS — Z79899 Other long term (current) drug therapy: Secondary | ICD-10-CM | POA: Insufficient documentation

## 2014-12-28 DIAGNOSIS — R079 Chest pain, unspecified: Secondary | ICD-10-CM | POA: Insufficient documentation

## 2014-12-28 DIAGNOSIS — M179 Osteoarthritis of knee, unspecified: Secondary | ICD-10-CM | POA: Diagnosis not present

## 2014-12-28 DIAGNOSIS — M549 Dorsalgia, unspecified: Secondary | ICD-10-CM | POA: Diagnosis not present

## 2014-12-28 LAB — CBC
HEMATOCRIT: 36.4 % (ref 36.0–46.0)
HEMOGLOBIN: 12.3 g/dL (ref 12.0–15.0)
MCH: 26.5 pg (ref 26.0–34.0)
MCHC: 33.8 g/dL (ref 30.0–36.0)
MCV: 78.3 fL (ref 78.0–100.0)
Platelets: 290 10*3/uL (ref 150–400)
RBC: 4.65 MIL/uL (ref 3.87–5.11)
RDW: 14.9 % (ref 11.5–15.5)
WBC: 9.9 10*3/uL (ref 4.0–10.5)

## 2014-12-28 LAB — BASIC METABOLIC PANEL
ANION GAP: 8 (ref 5–15)
BUN: 7 mg/dL (ref 6–20)
CALCIUM: 8.7 mg/dL — AB (ref 8.9–10.3)
CO2: 25 mmol/L (ref 22–32)
CREATININE: 0.78 mg/dL (ref 0.44–1.00)
Chloride: 104 mmol/L (ref 101–111)
GFR calc Af Amer: >60 mL/min (ref >60)
Glucose, Bld: 78 mg/dL (ref 65–99)
Potassium: 3.6 mmol/L (ref 3.5–5.1)
SODIUM: 137 mmol/L (ref 135–145)

## 2014-12-28 LAB — I-STAT TROPONIN, ED: Troponin i, poc: 0 ng/mL (ref 0.00–0.08)

## 2014-12-28 NOTE — ED Notes (Addendum)
She c/o pain in chest radiating down entire R side of body onset while at rest yesterday. Pain has persisted since onset. Denies nausea, sob, diaphoresis. Nothing relieves the pain, nothing increases the pain. A&Ox4, resp e/u. seh found a lump in her R breast this am

## 2014-12-28 NOTE — ED Notes (Signed)
Patient states unable to wait for discharge paper work and needed to leave the ED without discharge papers and walked out of the ED.

## 2014-12-28 NOTE — ED Provider Notes (Signed)
CSN: [id number]     Arrival date & time 12/28/14  1727 History   First MD Initiated Contact with Patient 12/28/14 1822     Chief Complaint  Patient presents with  . Chest Pain     (Consider location/radiation/quality/duration/timing/severity/associated sxs/prior Treatment) Patient is a 44 y.o. female presenting with chest pain.  Chest Pain Pain location:  R chest Pain quality: sharp   Radiates to: up and down the right side of her body. Pain radiates to the back: no   Pain severity:  Moderate Onset quality:  Gradual Duration:  3 days Timing:  Sporadic Progression:  Waxing and waning Chronicity:  New Context: no intercourse, not lifting, no movement, not raising an arm, not at rest, no stress and no trauma   Relieved by:  None tried Worsened by:  Nothing tried Associated symptoms: back pain   Associated symptoms: no abdominal pain, no altered mental status, no anorexia, no anxiety, no claudication, no cough, no diaphoresis, no dizziness, no fatigue, no fever, no headache, no heartburn, no lower extremity edema, no nausea, no orthopnea, no palpitations, no shortness of breath, not vomiting and no weakness   Risk factors: no birth control, no diabetes mellitus, no hypertension, not female, not pregnant, no prior DVT/PE, no smoking and no surgery     Past Medical History  Diagnosis Date  . Arthritis     BILATERAL KNEES  . Anemia    Past Surgical History  Procedure Laterality Date  . Breast surgery    . Cesarean section    . Tubal ligation     Family History  Problem Relation Age of Onset  . Heart disease Maternal Grandmother   . Heart attack Maternal Grandmother   . Heart disease Maternal Grandfather   . Heart attack Maternal Grandfather   . Heart disease Paternal Grandmother   . Heart attack Paternal Grandmother    History  Substance Use Topics  . Smoking status: Never Smoker   . Smokeless tobacco: Never Used  . Alcohol Use: Yes     Comment: 2/month   OB History     No data available     Review of Systems  Constitutional: Negative for fever, chills, diaphoresis, appetite change and fatigue.  HENT: Negative for congestion, ear pain, facial swelling, mouth sores and sore throat.   Eyes: Negative for visual disturbance.  Respiratory: Negative for cough, chest tightness and shortness of breath.   Cardiovascular: Positive for chest pain. Negative for palpitations, orthopnea and claudication.  Gastrointestinal: Negative for heartburn, nausea, vomiting, abdominal pain, diarrhea, blood in stool and anorexia.  Endocrine: Negative for cold intolerance and heat intolerance.  Genitourinary: Negative for frequency, decreased urine volume and difficulty urinating.  Musculoskeletal: Positive for back pain. Negative for neck stiffness.  Skin: Negative for rash.  Neurological: Negative for dizziness, weakness, light-headedness and headaches.  All other systems reviewed and are negative.     Allergies  Review of patient's allergies indicates no known allergies.  Home Medications   Prior to Admission medications   Medication Sig Start Date End Date Taking? Authorizing Provider  meloxicam (MOBIC) 15 MG tablet Take 15 mg by mouth daily as needed for pain (knee pain).    Historical Provider, MD  phentermine 37.5 MG capsule Take 37.5 mg by mouth daily.    Historical Provider, MD  Vitamin D, Ergocalciferol, (DRISDOL) 50000 UNITS CAPS capsule Take 50,000 Units by mouth every 7 (seven) days. [id number]    Historical Provider, MD   BP 100/49 mmHg  Pulse 76  Temp(Src) 98.8 F (37.1 C) (Oral)  Resp 19  SpO2 100%  LMP 12/01/2014 Physical Exam  Constitutional: She is oriented to person, place, and time. She appears well-developed and well-nourished. No distress.  HENT:  Head: Normocephalic and atraumatic.  Right Ear: External ear normal.  Left Ear: External ear normal.  Nose: Nose normal.  Eyes: Conjunctivae and EOM are normal. Pupils are equal, round, and  reactive to light. Right eye exhibits no discharge. Left eye exhibits no discharge. No scleral icterus.  Neck: Normal range of motion. Neck supple.  Cardiovascular: Normal rate, regular rhythm and normal heart sounds.  Exam reveals no gallop and no friction rub.   No murmur heard. Pulmonary/Chest: Effort normal and breath sounds normal. No stridor. No respiratory distress. She has no wheezes.  Abdominal: Soft. She exhibits no distension. There is no tenderness.  Musculoskeletal: She exhibits no edema or tenderness.  Neurological: She is alert and oriented to person, place, and time.  Skin: Skin is warm and dry. No rash noted. She is not diaphoretic. No erythema.  Psychiatric: She has a normal mood and affect.    ED Course  Procedures (including critical care time) Labs Review Labs Reviewed  BASIC METABOLIC PANEL - Abnormal; Notable for the following:    Calcium 8.7 (*)    All other components within normal limits  CBC  I-STAT TROPOININ, ED    Imaging Review Dg Chest 2 View  12/28/2014   CLINICAL DATA:  Right-sided chest pain under the breast with shortness of breath for 2 days.  EXAM: CHEST  2 VIEW  COMPARISON:  09/01/2013  FINDINGS: There is no edema, consolidation, effusion, or pneumothorax. Normal heart size and mediastinal contours. No osseous findings to explain chest pain  IMPRESSION: Negative chest.   Electronically Signed   By: Monte Fantasia M.D.   On: 12/28/2014 18:04     EKG Interpretation   Date/Time:  Tuesday December 28 2014 17:34:56 EDT Ventricular Rate:  101 PR Interval:  150 QRS Duration: 70 QT Interval:  338 QTC Calculation: 438 R Axis:   95 Text Interpretation:  Sinus tachycardia Rightward axis Nonspecific T wave  abnormality Abnormal ECG rate is faster on this EKG artifact noted\E\  Confirmed by Christy Gentles  MD, Elenore Rota ([id number]) on 12/28/2014 7:05:00 PM      MDM   44 year old female with no pertinent past medical history presents to the ED with 2 days of  intermittent right-sided chest pain described as sharp that radiates up and down the right side of her body. Rest of the history and exam as above.  EKG with sinus tachycardia, right axis deviation, normal intervals. Nonspecific T-wave abnormalities with no evidence of acute ischemia, arrhythmia, or blocks.  CBC without leukocytosis or anemia. BMP without significant electrolyte derangement. Initial troponin negative. Chest x-ray with no evidence of pneumonia, pneumothorax, pneumomediastinum, widening or abnormal contour of the mediastinum, pleural effusions, pulmonary edema. Low pretest probability and clinical suspicion for pulmonary embolism. Low suspicion for ACS. Etiology of patient's presentation undetermined at this time however no no evidence of serious etiology discovered. Patient is currently asymptomatic, well-appearing, nontoxic, and hemodynamically stable.  Patient is in good condition safely discharged with strict return precautions. Patient to follow-up with PCP as needed.  Vision seen in conjunction with Dr. Christy Gentles.  Final diagnoses:  Chest pain, unspecified chest pain type        Addison Lank, MD 12/29/14 9622  Ripley Fraise, MD 12/29/14 [phone]

## 2014-12-28 NOTE — ED Notes (Signed)
Dr. Cardama at the bedside.  

## 2014-12-28 NOTE — Discharge Instructions (Signed)

## 2015-04-15 ENCOUNTER — Encounter (HOSPITAL_COMMUNITY): Payer: Self-pay | Admitting: Emergency Medicine

## 2015-04-15 ENCOUNTER — Emergency Department (HOSPITAL_COMMUNITY)
Admission: EM | Admit: 2015-04-15 | Discharge: 2015-04-15 | Discharge status: Against Medical Advice | Payer: 59 | Attending: Emergency Medicine | Admitting: Emergency Medicine

## 2015-04-15 DIAGNOSIS — R197 Diarrhea, unspecified: Secondary | ICD-10-CM | POA: Diagnosis not present

## 2015-04-15 DIAGNOSIS — R112 Nausea with vomiting, unspecified: Secondary | ICD-10-CM | POA: Insufficient documentation

## 2015-04-15 DIAGNOSIS — R070 Pain in throat: Secondary | ICD-10-CM | POA: Diagnosis not present

## 2015-04-15 DIAGNOSIS — R51 Headache: Secondary | ICD-10-CM | POA: Diagnosis present

## 2015-04-15 DIAGNOSIS — R42 Dizziness and giddiness: Secondary | ICD-10-CM | POA: Insufficient documentation

## 2015-04-15 NOTE — ED Notes (Signed)
This RN called one last time for patient. No answer from lobby.

## 2015-04-15 NOTE — ED Notes (Addendum)
Pt states she has had a frontal HA with n/v/d, some dizziness, and throat pain. Denies any CP, ABD pain or diaphoresis. Neuro assessment WNL. Denies hx of migraine or HA. Denies any trauma or injury

## 2015-04-15 NOTE — ED Notes (Signed)
Called pt x2 for blood draw still no answer, notified triage nurse

## 2015-04-15 NOTE — ED Notes (Signed)
Called pt for blood draw, no answer

## 2015-07-05 ENCOUNTER — Other Ambulatory Visit: Payer: Self-pay | Admitting: Family Medicine

## 2015-07-05 DIAGNOSIS — R928 Other abnormal and inconclusive findings on diagnostic imaging of breast: Secondary | ICD-10-CM

## 2015-07-05 DIAGNOSIS — N6310 Unspecified lump in the right breast, unspecified quadrant: Secondary | ICD-10-CM

## 2015-07-07 ENCOUNTER — Ambulatory Visit
Admission: RE | Admit: 2015-07-07 | Discharge: 2015-07-07 | Disposition: A | Discharge status: Home / Self Care | Payer: 59 | Source: Ambulatory Visit | Attending: Family Medicine | Admitting: Family Medicine

## 2015-07-07 DIAGNOSIS — R928 Other abnormal and inconclusive findings on diagnostic imaging of breast: Secondary | ICD-10-CM

## 2015-07-07 DIAGNOSIS — N6310 Unspecified lump in the right breast, unspecified quadrant: Secondary | ICD-10-CM

## 2015-07-08 ENCOUNTER — Other Ambulatory Visit: Payer: 59

## 2015-07-25 ENCOUNTER — Ambulatory Visit (INDEPENDENT_AMBULATORY_CARE_PROVIDER_SITE_OTHER): Payer: 59 | Admitting: Emergency Medicine

## 2015-07-25 VITALS — BP 118/76 | HR 82 | Temp 98.1°F | Resp 16 | Ht 65.0 in | Wt 270.0 lb

## 2015-07-25 DIAGNOSIS — S335XXA Sprain of ligaments of lumbar spine, initial encounter: Secondary | ICD-10-CM | POA: Diagnosis not present

## 2015-07-25 LAB — POCT URINALYSIS DIP (MANUAL ENTRY)
BILIRUBIN UA: NEGATIVE
BILIRUBIN UA: NEGATIVE
GLUCOSE UA: NEGATIVE
Nitrite, UA: NEGATIVE
Protein Ur, POC: NEGATIVE
SPEC GRAV UA: 1.015
Urobilinogen, UA: 0.2
pH, UA: 6

## 2015-07-25 LAB — POC MICROSCOPIC URINALYSIS (UMFC)

## 2015-07-25 MED ORDER — NAPROXEN SODIUM 550 MG PO TABS: 550.0000 mg | ORAL_TABLET | Freq: Two times a day (BID) | ORAL | Status: DC

## 2015-07-25 MED ORDER — TRAMADOL HCL 50 MG PO TABS: 50.0000 mg | ORAL_TABLET | Freq: Three times a day (TID) | ORAL | Status: DC | PRN

## 2015-07-25 MED ORDER — CYCLOBENZAPRINE HCL 5 MG PO TABS: 5.0000 mg | ORAL_TABLET | Freq: Three times a day (TID) | ORAL | Status: DC | PRN

## 2015-07-25 NOTE — Progress Notes (Signed)
Subjective:  Patient ID: Sandra Jenkins, female    DOB: 1970/11/26  Age: 45 y.o. MRN: [id number]  CC: Back Pain   HPI Sandra Jenkins presents   Patient is a long term history of pain in Sandra Jenkins low back. It's intermittent. She did experience Main for over a month. She denies any hitory of injury or overuse.  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 She has no dysuria urgency or frequency. No abdominal pain. No vaginal dischage or bleeding. No stool change. No fever chills  Said that the pain that she is experiencing is in the right lower back into Sandra Jenkins buttock. It doesn't radiate into Sandra Jenkins legs. She has no numbness tingling or weakness denies other complaints.  History Sandra Jenkins has a past medical history of Arthritis and Anemia.   She has past surgical history that includes Breast surgery; Cesarean section; and Tubal ligation.   Sandra Jenkins  family history includes Heart attack in Sandra Jenkins maternal grandfather, maternal grandmother, and paternal grandmother; Heart disease in Sandra Jenkins maternal grandfather, maternal grandmother, and paternal grandmother.  She   reports that she has never smoked. She has never used smokeless tobacco. She reports that she drinks about 1.2 - 1.8 oz of alcohol per week. She reports that she does not use illicit drugs.  Outpatient Prescriptions Prior to Visit  Medication Sig Dispense Refill  . meloxicam (MOBIC) 15 MG tablet Take 15 mg by mouth daily as needed for pain (knee pain).    . phentermine 37.5 MG capsule Take 37.5 mg by mouth daily.    . Vitamin D, Ergocalciferol, (DRISDOL) 50000 UNITS CAPS capsule Take 50,000 Units by mouth every 7 (seven) days. wednesdays     No facility-administered medications prior to visit.    Social History   Social History  . Marital Status: Married    Spouse Name: N/A  . Number of Children: N/A  . Years of Education: N/A   Occupational History  . [employer]   Social History Main Topics  . Smoking status: Never  Smoker   . Smokeless tobacco: Never Used  . Alcohol Use: 1.2 - 1.8 oz/week    2-3 Standard drinks or equivalent per week     Comment: 2/month  . Drug Use: No  . Sexual Activity: Not Asked   Other Topics Concern  . None   Social History Narrative     Review of Systems  Constitutional: Negative for fever, chills and appetite change.  HENT: Negative for congestion, ear pain, postnasal drip, sinus pressure and sore throat.   Eyes: Negative for pain and redness.  Respiratory: Negative for cough, shortness of breath and wheezing.   Cardiovascular: Negative for leg swelling.  Gastrointestinal: Negative for nausea, vomiting, abdominal pain, diarrhea, constipation and blood in stool.  Endocrine: Negative for polyuria.  Genitourinary: Negative for dysuria, urgency, frequency and flank pain.  Musculoskeletal: Positive for back pain. Negative for gait problem.  Skin: Negative for rash.  Neurological: Negative for weakness and headaches.  Psychiatric/Behavioral: Negative for confusion and decreased concentration. The patient is not nervous/anxious.     Objective:  BP 118/76 mmHg  Pulse 82  Temp(Src) 98.1 F (36.7 C) (Oral)  Resp 16  Ht 5\' 5"  (1.651 m)  Wt 270 lb (122.471 kg)  BMI 44.93 kg/m2  SpO2 98%  LMP 07/18/2015 (Approximate)  Physical Exam  Constitutional: She is oriented to person, place, and time. She appears well-developed and well-nourished.  HENT:  Head: Normocephalic and atraumatic.  Eyes:  Conjunctivae are normal. Pupils are equal, round, and reactive to light.  Pulmonary/Chest: Effort normal.  Musculoskeletal: She exhibits no edema.       Lumbar back: She exhibits tenderness.  Neurological: She is alert and oriented to person, place, and time.  Skin: Skin is dry.  Psychiatric: She has a normal mood and affect. Sandra Jenkins behavior is normal. Thought content normal.      Assessment & Plan:   Sandra Jenkins was seen today for back pain.  Diagnoses and all orders for this  visit:  Sprain of lumbar region, initial encounter -     POCT urinalysis dipstick -     POCT Microscopic Urinalysis ([facility])  Morbid obesity due to excess calories ([facility]) -     POCT urinalysis dipstick -     POCT Microscopic Urinalysis ([facility])  Other orders -     naproxen sodium (ANAPROX DS) 550 MG tablet; Take 1 tablet (550 mg total) by mouth 2 (two) times daily with a meal. -     cyclobenzaprine (FLEXERIL) 5 MG tablet; Take 1 tablet (5 mg total) by mouth 3 (three) times daily as needed for muscle spasms. -     traMADol (ULTRAM) 50 MG tablet; Take 1 tablet (50 mg total) by mouth every 8 (eight) hours as needed.  I am having Sandra Jenkins start on naproxen sodium, cyclobenzaprine, and traMADol. I am also having Sandra Jenkins maintain Sandra Jenkins Vitamin D (Ergocalciferol), phentermine, and meloxicam.  Meds ordered this encounter  Medications  . naproxen sodium (ANAPROX DS) 550 MG tablet    Sig: Take 1 tablet (550 mg total) by mouth 2 (two) times daily with a meal.    Dispense:  40 tablet    Refill:  0  . cyclobenzaprine (FLEXERIL) 5 MG tablet    Sig: Take 1 tablet (5 mg total) by mouth 3 (three) times daily as needed for muscle spasms.    Dispense:  30 tablet    Refill:  1  . traMADol (ULTRAM) 50 MG tablet    Sig: Take 1 tablet (50 mg total) by mouth every 8 (eight) hours as needed.    Dispense:  30 tablet    Refill:  0    Appropriate red flag conditions were discussed with the patient as well as actions that should be taken.  Patient expressed his understanding.  Follow-up: Return if symptoms worsen or fail to improve.  Roselee Culver, MD .  Results for orders placed or performed in visit on 07/25/15  POCT urinalysis dipstick  Result Value Ref Range   Color, UA yellow yellow   Clarity, UA cloudy (A) clear   Glucose, UA negative negative   Bilirubin, UA negative negative   Ketones, POC UA negative negative   Spec Grav, UA 1.015    Blood, UA large (A) negative   pH, UA 6.0    Protein  Ur, POC negative negative   Urobilinogen, UA 0.2    Nitrite, UA Negative Negative   Leukocytes, UA small (1+) (A) Negative  POCT Microscopic Urinalysis ([facility])  Result Value Ref Range   WBC,UR,HPF,POC Many (A) None WBC/hpf   RBC,UR,HPF,POC None None RBC/hpf   Bacteria Few (A) None, Too numerous to count   Mucus Present (A) Absent   Epithelial Cells, UR Per Microscopy Many (A) None, Too numerous to count cells/hpf

## 2015-07-25 NOTE — Patient Instructions (Signed)

## 2015-10-05 ENCOUNTER — Other Ambulatory Visit: Payer: Self-pay

## 2015-10-05 MED ORDER — NAPROXEN SODIUM 550 MG PO TABS: 550.0000 mg | ORAL_TABLET | Freq: Two times a day (BID) | ORAL | Status: DC

## 2015-10-05 NOTE — Telephone Encounter (Signed)
Pharm reqs RFs of naproxen. OK to RF?

## 2015-10-05 NOTE — Telephone Encounter (Signed)
I will provide with 15 day supply and 1 refill but patient should come back for recheck.

## 2015-10-08 ENCOUNTER — Ambulatory Visit (INDEPENDENT_AMBULATORY_CARE_PROVIDER_SITE_OTHER): Payer: 59 | Admitting: Family Medicine

## 2015-10-08 VITALS — BP 110/75 | HR 80 | Temp 98.3°F | Resp 16 | Ht 64.0 in | Wt 272.2 lb

## 2015-10-08 DIAGNOSIS — M5440 Lumbago with sciatica, unspecified side: Secondary | ICD-10-CM

## 2015-10-08 DIAGNOSIS — Z0000 Encounter for general adult medical examination without abnormal findings: Secondary | ICD-10-CM | POA: Diagnosis not present

## 2015-10-08 DIAGNOSIS — N898 Other specified noninflammatory disorders of vagina: Secondary | ICD-10-CM

## 2015-10-08 DIAGNOSIS — Z124 Encounter for screening for malignant neoplasm of cervix: Secondary | ICD-10-CM

## 2015-10-08 LAB — POCT CBC
Granulocyte percent: 58 %G (ref 37–80)
HCT, POC: 36.3 % — AB (ref 37.7–47.9)
Hemoglobin: 12.1 g/dL — AB (ref 12.2–16.2)
Lymph, poc: 3.7 — AB (ref 0.6–3.4)
MCH, POC: 26.6 pg — AB (ref 27–31.2)
MCHC: 33.2 g/dL (ref 31.8–35.4)
MCV: 80 fL (ref 80–97)
MID (cbc): 0.4 (ref 0–0.9)
MPV: 8.9 fL (ref 0–99.8)
POC Granulocyte: 5.6 (ref 2–6.9)
POC LYMPH PERCENT: 38.2 %L (ref 10–50)
POC MID %: 3.8 %M (ref 0–12)
Platelet Count, POC: 256 10*3/uL (ref 142–424)
RBC: 4.53 M/uL (ref 4.04–5.48)
RDW, POC: 15 %
WBC: 9.6 10*3/uL (ref 4.6–10.2)

## 2015-10-08 LAB — POC MICROSCOPIC URINALYSIS (UMFC)

## 2015-10-08 LAB — POCT WET + KOH PREP: Trich by wet prep: ABSENT

## 2015-10-08 LAB — POCT URINALYSIS DIP (MANUAL ENTRY)
Bilirubin, UA: NEGATIVE
Blood, UA: NEGATIVE
Glucose, UA: NEGATIVE
Ketones, POC UA: NEGATIVE
Nitrite, UA: NEGATIVE
Protein Ur, POC: NEGATIVE
Spec Grav, UA: 1.025
Urobilinogen, UA: 1
pH, UA: 5.5

## 2015-10-08 MED ORDER — FLUCONAZOLE 150 MG PO TABS: 150.0000 mg | ORAL_TABLET | Freq: Once | ORAL | Status: DC

## 2015-10-08 NOTE — Progress Notes (Signed)
Patient ID: Sandra Jenkins MRN: [id number], DOB: 1971/06/25, 45 y.o. Date of Encounter: 10/08/2015, 4:47 PM  Primary Physician: Dennie Bible, PA-C  Chief Complaint: Physical (CPE)  HPI: 45 y.o. y/o female with history of noted below here for CPE.  She works in Therapist, art.  Married, three children. She has low back pain and was treated for a back strain in January with Naproxen which worked, but pain returned when the medicine ran out.  Pain runs down the back of each of her legs. She is out of Vitamin D.  LMP: February 2017 (she sometimes skips a month) Pap:  2 years ago MMG: December [id number] Last Td: uncertain Review of Systems: Consitutional: No fever, chills, fatigue, night sweats, lymphadenopathy, or weight changes. Eyes: No visual changes, eye redness, or discharge. ENT/Mouth: Ears: No otalgia, tinnitus, hearing loss, discharge. Nose: No congestion, rhinorrhea, sinus pain, or epistaxis. Throat: No sore throat, post nasal drip, or teeth pain. Cardiovascular: No CP, palpitations, diaphoresis, DOE, edema, orthopnea, PND. Respiratory: No cough, hemoptysis, SOB, or wheezing. Gastrointestinal: No anorexia, dysphagia, reflux, pain, nausea, vomiting, hematemesis, diarrhea, constipation, BRBPR, or melena. Breast: No discharge, pain, swelling, or mass. Genitourinary: No dysuria, frequency, urgency, hematuria, incontinence, nocturia, amenorrhea, vaginal discharge, pruritis, burning, abnormal bleeding, or pain. Musculoskeletal: No decreased ROM, myalgias, stiffness, joint swelling, or weakness. Skin: No rash, erythema, lesion changes, pain, warmth, jaundice, or pruritis. Neurological: No headache, dizziness, syncope, seizures, tremors, memory loss, coordination problems, or paresthesias. Psychological: No anxiety, depression, hallucinations, SI/HI. Endocrine: No fatigue, polydipsia, polyphagia, polyuria, or known diabetes. All other systems were reviewed and are otherwise  negative.  Past Medical History  Diagnosis Date  . Arthritis     BILATERAL KNEES  . Anemia   . Sickle cell anemia [facility])      Past Surgical History  Procedure Laterality Date  . Breast surgery    . Cesarean section    . Tubal ligation    . Cosmetic surgery      Home Meds:  Prior to Admission medications   Medication Sig Start Date End Date Taking? Authorizing Provider  cyclobenzaprine (FLEXERIL) 5 MG tablet Take 1 tablet (5 mg total) by mouth 3 (three) times daily as needed for muscle spasms. 07/25/15  Yes Roselee Culver, MD  naproxen sodium (ANAPROX DS) 550 MG tablet Take 1 tablet (550 mg total) by mouth 2 (two) times daily with a meal. 10/05/15 10/04/16 Yes Jaynee Eagles, PA-C  meloxicam (MOBIC) 15 MG tablet Take 15 mg by mouth daily as needed for pain (knee pain). Reported on 10/08/2015    Historical Provider, MD  phentermine 37.5 MG capsule Take 37.5 mg by mouth daily. Reported on 10/08/2015    Historical Provider, MD  traMADol (ULTRAM) 50 MG tablet Take 1 tablet (50 mg total) by mouth every 8 (eight) hours as needed. Patient not taking: Reported on 10/08/2015 07/25/15   Roselee Culver, MD  Vitamin D, Ergocalciferol, (DRISDOL) 50000 UNITS CAPS capsule Take 50,000 Units by mouth every 7 (seven) days. Reported on 10/08/2015    Historical Provider, MD    Allergies: No Known Allergies  Social History   Social History  . Marital Status: Married    Spouse Name: N/A  . Number of Children: N/A  . Years of Education: N/A   Occupational History  . [employer]   Social History Main Topics  . Smoking status: Never Smoker   . Smokeless tobacco: Never Used  . Alcohol Use: 1.2 - 1.8 oz/week  2-3 Standard drinks or equivalent per week     Comment: 2/month  . Drug Use: No  . Sexual Activity: Not on file   Other Topics Concern  . Not on file   Social History Narrative    Family History  Problem Relation Age of Onset  . Heart disease Maternal Grandmother   .  Heart attack Maternal Grandmother   . Heart disease Maternal Grandfather   . Heart attack Maternal Grandfather   . Heart disease Paternal Grandmother   . Heart attack Paternal Grandmother     Physical Exam: Blood pressure 110/75, pulse 80, temperature 98.3 F (36.8 C), temperature source Oral, resp. rate 16, height 5\' 4"  (1.626 m), weight 272 lb 4 oz (123.492 kg), last menstrual period 08/24/2015, SpO2 98 %., Body mass index is 46.71 kg/(m^2). Wt Readings from Last 3 Encounters:  10/08/15 272 lb 4 oz (123.492 kg)  07/25/15 270 lb (122.471 kg)  03/24/14 261 lb 8 oz (118.616 kg)   BP Readings from Last 3 Encounters:  10/08/15 110/75  07/25/15 118/76  04/15/15 128/86   General: Well developed, well nourished, in no acute distress. HEENT: Normocephalic, atraumatic. Conjunctiva pink, sclera non-icteric. Pupils 2 mm constricting to 1 mm, round, regular, and equally reactive to light and accomodation. EOMI. Fundi benign   Internal auditory canal clear. TMs with good cone of light and without pathology. Nasal mucosa pink. Nares are without discharge. No sinus tenderness. Oral mucosa pink. Dentition good. Pharynx without exudate.   Bilateral cerumen impaction was irrigated this evening   Neck: Supple. Trachea midline. No thyromegaly. Full ROM. No lymphadenopathy. Lungs: Clear to auscultation bilaterally without wheezes, rales, or rhonchi. Breathing is of normal effort and unlabored. Cardiovascular: RRR with S1 S2. No murmurs, rubs, or gallops appreciated. Distal pulses 2+ symmetrically. No carotid or abdominal bruits. Breast: Symmetrical. No masses. Nipples without discharge. Abdomen: Soft, non-tender, non-distended with normoactive bowel sounds. No hepatosplenomegaly or masses. No rebound/guarding. No CVA tenderness. Without hernias.  Genitourinary:  External genitalia without lesions. Vaginal mucosa pink. Cervix pink and without discharge. No cervical or adnexal tenderness. Pap smear  taken Musculoskeletal: Full range of motion and 5/5 strength throughout. Without swelling, atrophy, tenderness, crepitus, or warmth. Extremities without clubbing, cyanosis, or edema. Calves supple.  Patient has back pain when she goes from lying to sitting on the exam table Skin: Warm and moist without erythema, ecchymosis, wounds, or rash. Neuro: A+Ox3. CN II-XII grossly intact. Moves all extremities spontaneously. Full sensation throughout. Normal gait. DTR 2+ throughout upper and lower extremities. Finger to nose intact. Psych:  Responds to questions appropriately with a normal affect.   Lab Results  Component Value Date   CHOL 134 09/28/2013   HDL 49 09/28/2013   LDLCALC 66 09/28/2013   TRIG 95 09/28/2013   CHOLHDL 2.7 09/28/2013    Assessment/Plan:  45 y.o. y/o female here for CPE   ICD-9-CM ICD-10-CM   1. Annual physical exam V70.0 Z00.00 POCT CBC     COMPLETE METABOLIC PANEL WITH GFR     Lipid panel     TSH     Pap IG, CT/NG w/ reflex HPV when ASC-U     HIV antibody     POCT urinalysis dipstick     POCT Microscopic Urinalysis ([facility])  2. Midline low back pain with sciatica, sciatica laterality unspecified 724.3 M54.40 Ambulatory referral to Physical Therapy  3. Vaginal discharge 623.5 N89.8 POCT Wet + KOH Prep     fluconazole (DIFLUCAN) 150 MG tablet  Signed, Robyn Haber, MD 10/08/2015 4:47 PM

## 2015-10-08 NOTE — Patient Instructions (Addendum)
Your weight is undoubtedly contributing to your back pain. At the same time, I think physical therapy will help strengthen the back muscles were your losing weight. For now, I think the Naprosyn is a reasonable choice for controlling the back pain  Regarding weight loss, several things are important keep in mind 1 eating after 7:00 PM contributes more calories than eating at any other time during the day 2 trying to anticipate the next days' menu is critical to avoiding foraging and eating foods you nor bad for you. 3 it's important to avoid drinks that have lots of extra calories such as sweet tea and sodas 4 while exercise is good for your heart and piece of mind, it does not significantly lower your weight.     Health Maintenance, Female Adopting a healthy lifestyle and getting preventive care can go a long way to promote health and wellness. Talk with your health care provider about what schedule of regular examinations is right for you. This is a good chance for you to check in with your provider about disease prevention and staying healthy. In between checkups, there are plenty of things you can do on your own. Experts have done a lot of research about which lifestyle changes and preventive measures are most likely to keep you healthy. Ask your health care provider for more information. WEIGHT AND DIET  Eat a healthy diet  Be sure to include plenty of vegetables, fruits, low-fat dairy products, and lean protein.  Do not eat a lot of foods high in solid fats, added sugars, or salt.  Get regular exercise. This is one of the most important things you can do for your health.  Most adults should exercise for at least 150 minutes each week. The exercise should increase your heart rate and make you sweat (moderate-intensity exercise).  Most adults should also do strengthening exercises at least twice a week. This is in addition to the moderate-intensity exercise.  Maintain a healthy  weight  Body mass index (BMI) is a measurement that can be used to identify possible weight problems. It estimates body fat based on height and weight. Your health care provider can help determine your BMI and help you achieve or maintain a healthy weight.  For females 49 years of age and older:   A BMI below 18.5 is considered underweight.  A BMI of 18.5 to 24.9 is normal.  A BMI of 25 to 29.9 is considered overweight.  A BMI of 30 and above is considered obese.  Watch levels of cholesterol and blood lipids  You should start having your blood tested for lipids and cholesterol at 46 years of age, then have this test every 5 years.  You may need to have your cholesterol levels checked more often if:  Your lipid or cholesterol levels are high.  You are older than 46 years of age.  You are at high risk for heart disease.  CANCER SCREENING   Lung Cancer  Lung cancer screening is recommended for adults 82-85 years old who are at high risk for lung cancer because of a history of smoking.  A yearly low-dose CT scan of the lungs is recommended for people who:  Currently smoke.  Have quit within the past 15 years.  Have at least a 30-pack-year history of smoking. A pack year is smoking an average of one pack of cigarettes a day for 1 year.  Yearly screening should continue until it has been 15 years since you quit.  Yearly screening should stop if you develop a health problem that would prevent you from having lung cancer treatment.  Breast Cancer  Practice breast self-awareness. This means understanding how your breasts normally appear and feel.  It also means doing regular breast self-exams. Let your health care provider know about any changes, no matter how small.  If you are in your 20s or 30s, you should have a clinical breast exam (CBE) by a health care provider every 1-3 years as part of a regular health exam.  If you are 96 or older, have a CBE every year. Also  consider having a breast X-ray (mammogram) every year.  If you have a family history of breast cancer, talk to your health care provider about genetic screening.  If you are at high risk for breast cancer, talk to your health care provider about having an MRI and a mammogram every year.  Breast cancer gene (BRCA) assessment is recommended for women who have family members with BRCA-related cancers. BRCA-related cancers include:  Breast.  Ovarian.  Tubal.  Peritoneal cancers.  Results of the assessment will determine the need for genetic counseling and BRCA1 and BRCA2 testing. Cervical Cancer Your health care provider may recommend that you be screened regularly for cancer of the pelvic organs (ovaries, uterus, and vagina). This screening involves a pelvic examination, including checking for microscopic changes to the surface of your cervix (Pap test). You may be encouraged to have this screening done every 3 years, beginning at age 58.  For women ages 47-65, health care providers may recommend pelvic exams and Pap testing every 3 years, or they may recommend the Pap and pelvic exam, combined with testing for human papilloma virus (HPV), every 5 years. Some types of HPV increase your risk of cervical cancer. Testing for HPV may also be done on women of any age with unclear Pap test results.  Other health care providers may not recommend any screening for nonpregnant women who are considered low risk for pelvic cancer and who do not have symptoms. Ask your health care provider if a screening pelvic exam is right for you.  If you have had past treatment for cervical cancer or a condition that could lead to cancer, you need Pap tests and screening for cancer for at least 20 years after your treatment. If Pap tests have been discontinued, your risk factors (such as having a new sexual partner) need to be reassessed to determine if screening should resume. Some women have medical problems that  increase the chance of getting cervical cancer. In these cases, your health care provider may recommend more frequent screening and Pap tests. Colorectal Cancer  This type of cancer can be detected and often prevented.  Routine colorectal cancer screening usually begins at 46 years of age and continues through 46 years of age.  Your health care provider may recommend screening at an earlier age if you have risk factors for colon cancer.  Your health care provider may also recommend using home test kits to check for hidden blood in the stool.  A small camera at the end of a tube can be used to examine your colon directly (sigmoidoscopy or colonoscopy). This is done to check for the earliest forms of colorectal cancer.  Routine screening usually begins at age 63.  Direct examination of the colon should be repeated every 5-10 years through 46 years of age. However, you may need to be screened more often if early forms of precancerous polyps or  small growths are found. Skin Cancer  Check your skin from head to toe regularly.  Tell your health care provider about any new moles or changes in moles, especially if there is a change in a mole's shape or color.  Also tell your health care provider if you have a mole that is larger than the size of a pencil eraser.  Always use sunscreen. Apply sunscreen liberally and repeatedly throughout the day.  Protect yourself by wearing long sleeves, pants, a wide-brimmed hat, and sunglasses whenever you are outside. HEART DISEASE, DIABETES, AND HIGH BLOOD PRESSURE   High blood pressure causes heart disease and increases the risk of stroke. High blood pressure is more likely to develop in:  People who have blood pressure in the high end of the normal range (130-139/85-89 mm Hg).  People who are overweight or obese.  People who are African American.  If you are 76-61 years of age, have your blood pressure checked every 3-5 years. If you are 78 years of  age or older, have your blood pressure checked every year. You should have your blood pressure measured twice--once when you are at a hospital or clinic, and once when you are not at a hospital or clinic. Record the average of the two measurements. To check your blood pressure when you are not at a hospital or clinic, you can use:  An automated blood pressure machine at a pharmacy.  A home blood pressure monitor.  If you are between 66 years and 52 years old, ask your health care provider if you should take aspirin to prevent strokes.  Have regular diabetes screenings. This involves taking a blood sample to check your fasting blood sugar level.  If you are at a normal weight and have a low risk for diabetes, have this test once every three years after 46 years of age.  If you are overweight and have a high risk for diabetes, consider being tested at a younger age or more often. PREVENTING INFECTION  Hepatitis B  If you have a higher risk for hepatitis B, you should be screened for this virus. You are considered at high risk for hepatitis B if:  You were born in a country where hepatitis B is common. Ask your health care provider which countries are considered high risk.  Your parents were born in a high-risk country, and you have not been immunized against hepatitis B (hepatitis B vaccine).  You have HIV or AIDS.  You use needles to inject street drugs.  You live with someone who has hepatitis B.  You have had sex with someone who has hepatitis B.  You get hemodialysis treatment.  You take certain medicines for conditions, including cancer, organ transplantation, and autoimmune conditions. Hepatitis C  Blood testing is recommended for:  Everyone born from [birth year] through 1966.  Anyone with known risk factors for hepatitis C. Sexually transmitted infections (STIs)  You should be screened for sexually transmitted infections (STIs) including gonorrhea and chlamydia if:  You are  sexually active and are younger than 46 years of age.  You are older than 46 years of age and your health care provider tells you that you are at risk for this type of infection.  Your sexual activity has changed since you were last screened and you are at an increased risk for chlamydia or gonorrhea. Ask your health care provider if you are at risk.  If you do not have HIV, but are at risk, it may be  recommended that you take a prescription medicine daily to prevent HIV infection. This is called pre-exposure prophylaxis (PrEP). You are considered at risk if:  You are sexually active and do not regularly use condoms or know the HIV status of your partner(s).  You take drugs by injection.  You are sexually active with a partner who has HIV. Talk with your health care provider about whether you are at high risk of being infected with HIV. If you choose to begin PrEP, you should first be tested for HIV. You should then be tested every 3 months for as long as you are taking PrEP.  PREGNANCY   If you are premenopausal and you may become pregnant, ask your health care provider about preconception counseling.  If you may become pregnant, take 400 to 800 micrograms (mcg) of folic acid every day.  If you want to prevent pregnancy, talk to your health care provider about birth control (contraception). OSTEOPOROSIS AND MENOPAUSE   Osteoporosis is a disease in which the bones lose minerals and strength with aging. This can result in serious bone fractures. Your risk for osteoporosis can be identified using a bone density scan.  If you are 103 years of age or older, or if you are at risk for osteoporosis and fractures, ask your health care provider if you should be screened.  Ask your health care provider whether you should take a calcium or vitamin D supplement to lower your risk for osteoporosis.  Menopause may have certain physical symptoms and risks.  Hormone replacement therapy may reduce some  of these symptoms and risks. Talk to your health care provider about whether hormone replacement therapy is right for you.  HOME CARE INSTRUCTIONS   Schedule regular health, dental, and eye exams.  Stay current with your immunizations.   Do not use any tobacco products including cigarettes, chewing tobacco, or electronic cigarettes.  If you are pregnant, do not drink alcohol.  If you are breastfeeding, limit how much and how often you drink alcohol.  Limit alcohol intake to no more than 1 drink per day for nonpregnant women. One drink equals 12 ounces of beer, 5 ounces of wine, or 1 ounces of hard liquor.  Do not use street drugs.  Do not share needles.  Ask your health care provider for help if you need support or information about quitting drugs.  Tell your health care provider if you often feel depressed.  Tell your health care provider if you have ever been abused or do not feel safe at home.   This information is not intended to replace advice given to you by your health care provider. Make sure you discuss any questions you have with your health care provider.   Document Released: 01/08/2011 Document Revised: 07/16/2014 Document Reviewed: 05/27/2013 Elsevier Interactive Patient Education [employer].

## 2015-10-09 LAB — COMPLETE METABOLIC PANEL WITH GFR
ALT: 9 U/L (ref 6–29)
AST: 11 U/L (ref 10–35)
Albumin: 3.8 g/dL (ref 3.6–5.1)
Alkaline Phosphatase: 87 U/L (ref 33–115)
BUN: 10 mg/dL (ref 7–25)
CO2: 29 mmol/L (ref 20–31)
Calcium: 9.1 mg/dL (ref 8.6–10.2)
Chloride: 103 mmol/L (ref 98–110)
Creat: 0.85 mg/dL (ref 0.50–1.10)
GFR, Est African American: >89 mL/min (ref >=60)
GFR, Est Non African American: 83 mL/min (ref >=60)
Glucose, Bld: 87 mg/dL (ref 65–99)
Potassium: 4 mmol/L (ref 3.5–5.3)
Sodium: 141 mmol/L (ref 135–146)
Total Bilirubin: 0.4 mg/dL (ref 0.2–1.2)
Total Protein: 7.1 g/dL (ref 6.1–8.1)

## 2015-10-09 LAB — LIPID PANEL
Cholesterol: 133 mg/dL (ref 125–200)
HDL: 46 mg/dL (ref >=46)
LDL Cholesterol: 70 mg/dL (ref <130)
Total CHOL/HDL Ratio: 2.9 Ratio (ref <=5.0)
Triglycerides: 85 mg/dL (ref <150)
VLDL: 17 mg/dL (ref <30)

## 2015-10-09 LAB — TSH: TSH: 3.41 mIU/L

## 2015-10-09 LAB — HIV ANTIBODY (ROUTINE TESTING W REFLEX): HIV 1&2 Ab, 4th Generation: NONREACTIVE

## 2015-10-11 LAB — PAP IG, CT-NG, RFX HPV ASCU
Chlamydia Probe Amp: NOT DETECTED
GC Probe Amp: NOT DETECTED

## 2015-10-13 ENCOUNTER — Telehealth: Payer: Self-pay

## 2015-10-13 NOTE — Telephone Encounter (Signed)
LMOM regarding PT referral/FMLA.  The patient wants to have coverage for her PT office visits under FMLA.  We will have to initiate the FMLA process, not the PT location.  The patient will have to ask her employer for [facility] paperwork, and it will need to be filled out by our office and the referring provider.

## 2015-11-01 ENCOUNTER — Telehealth: Payer: Self-pay

## 2015-11-01 NOTE — Telephone Encounter (Signed)
Patient needs FMLA forms completed so that she can attend PT that we referred her to. I have completed what I could from the [facility] notes and highlighted the areas that I was not sure about if you could look this over and finish filling it out for me. I will place them in your box on 11/01/15 if you could return them to the FMLA/Disability box at the 102 checkout desk with in 5-7 business days. Thank you.

## 2015-11-02 NOTE — Telephone Encounter (Signed)
Paperwork scanned and faxed to [facility] on 11/02/15

## 2015-11-23 DIAGNOSIS — Z0271 Encounter for disability determination: Secondary | ICD-10-CM

## 2015-12-01 ENCOUNTER — Other Ambulatory Visit: Payer: Self-pay | Admitting: Orthopedic Surgery

## 2015-12-01 DIAGNOSIS — M545 Low back pain: Secondary | ICD-10-CM

## 2015-12-12 ENCOUNTER — Ambulatory Visit
Admission: RE | Admit: 2015-12-12 | Discharge: 2015-12-12 | Disposition: A | Discharge status: Home / Self Care | Payer: 59 | Source: Ambulatory Visit | Attending: Orthopedic Surgery | Admitting: Orthopedic Surgery

## 2015-12-12 DIAGNOSIS — M545 Low back pain: Secondary | ICD-10-CM

## 2016-06-28 ENCOUNTER — Other Ambulatory Visit: Payer: Self-pay | Admitting: Family Medicine

## 2016-06-28 DIAGNOSIS — Z1231 Encounter for screening mammogram for malignant neoplasm of breast: Secondary | ICD-10-CM

## 2016-08-08 ENCOUNTER — Encounter: Payer: Self-pay | Admitting: Family Medicine

## 2016-08-08 ENCOUNTER — Ambulatory Visit (INDEPENDENT_AMBULATORY_CARE_PROVIDER_SITE_OTHER): Payer: 59 | Admitting: Family Medicine

## 2016-08-08 VITALS — BP 124/78 | HR 91 | Temp 98.7°F | Resp 16 | Ht 64.0 in | Wt 276.0 lb

## 2016-08-08 DIAGNOSIS — M5136 Other intervertebral disc degeneration, lumbar region: Secondary | ICD-10-CM

## 2016-08-08 DIAGNOSIS — Z124 Encounter for screening for malignant neoplasm of cervix: Secondary | ICD-10-CM | POA: Diagnosis not present

## 2016-08-08 DIAGNOSIS — Z13220 Encounter for screening for lipoid disorders: Secondary | ICD-10-CM | POA: Diagnosis not present

## 2016-08-08 DIAGNOSIS — Z23 Encounter for immunization: Secondary | ICD-10-CM

## 2016-08-08 DIAGNOSIS — Z131 Encounter for screening for diabetes mellitus: Secondary | ICD-10-CM

## 2016-08-08 DIAGNOSIS — Z0000 Encounter for general adult medical examination without abnormal findings: Secondary | ICD-10-CM | POA: Diagnosis not present

## 2016-08-08 DIAGNOSIS — Z1231 Encounter for screening mammogram for malignant neoplasm of breast: Secondary | ICD-10-CM | POA: Diagnosis not present

## 2016-08-08 DIAGNOSIS — Z1239 Encounter for other screening for malignant neoplasm of breast: Secondary | ICD-10-CM

## 2016-08-08 LAB — POCT URINALYSIS DIP (MANUAL ENTRY)
BILIRUBIN UA: NEGATIVE
BILIRUBIN UA: NEGATIVE
Blood, UA: NEGATIVE
Glucose, UA: NEGATIVE
LEUKOCYTES UA: NEGATIVE
Nitrite, UA: NEGATIVE
PROTEIN UA: NEGATIVE
SPEC GRAV UA: 1.015
Urobilinogen, UA: 0.2
pH, UA: 5.5

## 2016-08-08 NOTE — Progress Notes (Signed)
Subjective:    Patient ID: Sandra Jenkins, female    DOB: 1970/09/07, 46 y.o.   MRN: [id number]  08/08/2016  Annual Exam (pap)   HPI This 46 y.o. female presents for Complete Physical Examination.  Last physical: 10-08-2015 Pap smear:  10-08-2015  WNL; regular menses (6 days; cramping; heavy) Mammogram: 07-07-2015 Eye exam: 2017; +glasses Dental exam:  Every six months.  There is no immunization history for the selected administration types on file for this patient. BP Readings from Last 3 Encounters:  08/08/16 124/78  10/08/15 110/75  07/25/15 118/76   Wt Readings from Last 3 Encounters:  08/08/16 276 lb (125.2 kg)  10/08/15 272 lb 4 oz (123.5 kg)  07/25/15 270 lb (122.5 kg)   [facility] for DDD lumbar; OA knees; needs referral to ortho; Dr.Wang has performed injections.    Review of Systems  Constitutional: Negative for activity change, appetite change, chills, diaphoresis, fatigue, fever and unexpected weight change.  HENT: Negative for congestion, dental problem, drooling, ear discharge, ear pain, facial swelling, hearing loss, mouth sores, nosebleeds, postnasal drip, rhinorrhea, sinus pressure, sneezing, sore throat, tinnitus, trouble swallowing and voice change.   Eyes: Negative for photophobia, pain, discharge, redness, itching and visual disturbance.  Respiratory: Negative for apnea, cough, choking, chest tightness, shortness of breath, wheezing and stridor.   Cardiovascular: Negative for chest pain, palpitations and leg swelling.  Gastrointestinal: Negative for abdominal distention, abdominal pain, anal bleeding, blood in stool, constipation, diarrhea, nausea, rectal pain and vomiting.  Endocrine: Negative for cold intolerance, heat intolerance, polydipsia, polyphagia and polyuria.  Genitourinary: Negative for decreased urine volume, difficulty urinating, dyspareunia, dysuria, enuresis, flank pain, frequency, genital sores, hematuria, menstrual problem, pelvic  pain, urgency, vaginal bleeding, vaginal discharge and vaginal pain.  Musculoskeletal: Negative for arthralgias, back pain, gait problem, joint swelling, myalgias, neck pain and neck stiffness.  Skin: Negative for color change, pallor, rash and wound.  Allergic/Immunologic: Negative for environmental allergies, food allergies and immunocompromised state.  Neurological: Negative for dizziness, tremors, seizures, syncope, facial asymmetry, speech difficulty, weakness, light-headedness, numbness and headaches.  Hematological: Negative for adenopathy. Does not bruise/bleed easily.  Psychiatric/Behavioral: Negative for agitation, behavioral problems, confusion, decreased concentration, dysphoric mood, hallucinations, self-injury, sleep disturbance and suicidal ideas. The patient is not nervous/anxious and is not hyperactive.     Past Medical History:  Diagnosis Date  . Anemia    Sickle Cell Trait  . Arthritis    BILATERAL KNEES; DDD lumbar spine   Past Surgical History:  Procedure Laterality Date  . BREAST SURGERY     breast reduction  . CESAREAN SECTION    . COSMETIC SURGERY    . TUBAL LIGATION     No Known Allergies No current outpatient prescriptions on file.   No current facility-administered medications for this visit.    Social History   Social History  . Marital status: Married    Spouse name: N/A  . Number of children: 2  . Years of education: N/A   Occupational History  . [place]    customer service   Social History Main Topics  . Smoking status: Never Smoker  . Smokeless tobacco: Never Used  . Alcohol use 1.2 - 1.8 oz/week    2 - 3 Standard drinks or equivalent per week     Comment: 2/month  . Drug use: No  . Sexual activity: Yes   Other Topics Concern  . 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      Tobacco: none      Alcohol: wine weekends      Exercise: walking; cannot get on elliptical or treadmill; three days per week for 30 minutes      Seatbelt: 100%; no texting.   Family History  Problem Relation Age of Onset  . Heart disease Maternal Grandmother   . Heart attack Maternal Grandmother   . Heart disease Maternal Grandfather   . Heart attack Maternal Grandfather   . Heart disease Paternal Grandmother   . Heart attack Paternal Grandmother   . Arthritis Father        Objective:    BP 124/78   Pulse 91   Temp 98.7 F (37.1 C) (Oral)   Resp 16   Ht 5\' 4"  (1.626 m)   Wt 276 lb (125.2 kg)   LMP 07/04/2016   SpO2 94%   BMI 47.38 kg/m  Physical Exam  Constitutional: She is oriented to person, place, and time. She appears well-developed and well-nourished. No distress.  HENT:  Head: Normocephalic and atraumatic.  Right Ear: External ear normal.  Left Ear: External ear normal.  Nose: Nose normal.  Mouth/Throat: Oropharynx is clear and moist.  Eyes: Conjunctivae and EOM are normal. Pupils are equal, round, and reactive to light.  Neck: Normal range of motion and full passive range of motion without pain. Neck supple. No JVD present. Carotid bruit is not present. No thyromegaly present.  Cardiovascular: Normal rate, regular rhythm and normal heart sounds.  Exam reveals no gallop and no friction rub.   No murmur heard. Pulmonary/Chest: Effort normal and breath sounds normal. She has no wheezes. She has no rales. Right breast exhibits no inverted nipple, no mass, no nipple discharge, no skin change and no tenderness. Left breast exhibits no inverted nipple, no mass, no nipple discharge, no skin change and no tenderness. Breasts are symmetrical.  Abdominal: Soft. Bowel sounds are normal. She exhibits no distension and no mass. There is no tenderness. There is no rebound and no  guarding.  Genitourinary: Vagina normal and uterus normal. There is no rash, tenderness, lesion or injury on the right labia. There is no rash, tenderness, lesion or injury on the left labia. Cervix exhibits no motion tenderness. Right adnexum displays no mass, no tenderness and no fullness. Left adnexum displays no mass, no tenderness and no fullness.  Musculoskeletal:       Right shoulder: Normal.       Left shoulder: Normal.       Cervical back: Normal.  Lymphadenopathy:    She has no cervical adenopathy.  Neurological: She is alert and oriented to person, place, and time. She has normal reflexes. No cranial nerve deficit. She exhibits normal muscle tone. Coordination normal.  Skin: Skin is warm and dry. No rash noted. She is not diaphoretic. No erythema. No pallor.  Psychiatric: She has a normal mood and affect. Her behavior is normal. Judgment and thought content normal.  Nursing note and vitals reviewed.  Depression screen [facility] 2/9 08/08/2016 10/08/2015 07/25/2015  Decreased Interest 0 0 0  Down, Depressed, Hopeless 0 0 0  PHQ - 2 Score 0 0 0      Assessment & Plan:   1.  Routine physical examination   2. Morbid obesity ([place])   3. Screening for diabetes mellitus   4. Screening, lipid   5. Degenerative disc disease, lumbar   6. Need for Tdap vaccination   7. Breast cancer screening   8. Pap smear for cervical cancer screening    -anticipatory guidance --- exercise, weight loss, low-calorie food choices. -pap smear obtained; refer for mammogram. -BTL for contraception. -s/p TDAP. -obtain age appropriate labs screening. -refer to ortho due to chronic lumbar DDD.   Orders Placed This Encounter  Procedures  . MM SCREENING BREAST TOMO BILATERAL    Standing Status:   Future    Standing Expiration Date:   10/09/2017    Order Specific Question:   Reason for Exam (SYMPTOM  OR DIAGNOSIS REQUIRED)    Answer:   annual screening    Order Specific Question:   Is the patient pregnant?     Answer:   No    Order Specific Question:   Preferred imaging location?    Answer:   [facility]  . Tdap vaccine greater than or equal to 7yo IM  . CBC with Differential/Platelet  . Comprehensive metabolic panel    Order Specific Question:   Has the patient fasted?    Answer:   Yes  . Hemoglobin A1c  . TSH  . Lipid panel    Order Specific Question:   Has the patient fasted?    Answer:   Yes  . Ambulatory referral to Orthopedic Surgery    Referral Priority:   Routine    Referral Type:   Surgical    Referral Reason:   Specialty Services Required    Requested Specialty:   Orthopedic Surgery    Number of Visits Requested:   1  . POCT urinalysis dipstick   No orders of the defined types were placed in this encounter.   Return in about 1 year (around 08/08/2017) for complete physical examiniation.   Jerilyn Gillaspie Elayne Guerin, M.D. Primary [facility] at [facility] [address] [place], [place]  [phone] phone [phone] fax

## 2016-08-08 NOTE — Patient Instructions (Addendum)
Claritin 10mg  one daily.     IF you received an x-ray today, you will receive an invoice from [facility]. Please contact [facility] at [phone] with questions or concerns regarding your invoice.   IF you received labwork today, you will receive an invoice from [facility]. Please contact [facility] at [phone] with questions or concerns regarding your invoice.   Our billing staff will not be able to assist you with questions regarding bills from these companies.  You will be contacted with the lab results as soon as they are available. The fastest way to get your results is to activate your My Chart account. Instructions are located on the last page of this paperwork. If you have not heard from [place] regarding the results in 2 weeks, please contact this office.     Keeping You Healthy  Get These Tests 1. Blood Pressure- Have your blood pressure checked once a year by your health care provider.  Normal blood pressure is 120/80. 2. Weight- Have your body mass index (BMI) calculated to screen for obesity.  BMI is measure of body fat based on height and weight.  You can also calculate your own BMI at GravelBags.it. 3. Cholesterol- Have your cholesterol checked every 5 years starting at age 18 then yearly starting at age 64. 24. Chlamydia, HIV, and other sexually transmitted diseases- Get screened every year until age 23, then within three months of each new sexual provider. 5. Pap Test - Every 1-5 years; discuss with your health care provider. 6. Mammogram- Every 1-2 years starting at age 36--50  Take these medicines  Calcium with Vitamin D-Your body needs 1200 mg of Calcium each day and [phone] IU of Vitamin D daily.  Your body can only absorb 500 mg of Calcium at a time so Calcium must be taken in 2 or 3 divided doses throughout the day.  Multivitamin with folic acid- Once daily if it is possible for you to become pregnant.  Get these  Immunizations  Gardasil-Series of three doses; prevents HPV related illness such as genital warts and cervical cancer.  Menactra-Single dose; prevents meningitis.  Tetanus shot- Every 10 years.  Flu shot-Every year.  Take these steps 1. Do not smoke-Your healthcare provider can help you quit.  For tips on how to quit go to www.smokefree.gov or call 1-800 QUITNOW. 2. Be physically active- Exercise 5 days a week for at least 30 minutes.  If you are not already physically active, start slow and gradually work up to 30 minutes of moderate physical activity.  Examples of moderate activity include walking briskly, dancing, swimming, bicycling, etc. 3. Breast Cancer- A self breast exam every month is important for early detection of breast cancer.  For more information and instruction on self breast exams, ask your healthcare provider or https://www.patel.info/. 4. Eat a healthy diet- Eat a variety of healthy foods such as fruits, vegetables, whole grains, low fat milk, low fat cheeses, yogurt, lean meats, poultry and fish, beans, nuts, tofu, etc.  For more information go to www. Thenutritionsource.org 5. Drink alcohol in moderation- Limit alcohol intake to one drink or less per day. Never drink and drive. 6. Depression- Your emotional health is as important as your physical health.  If you're feeling down or losing interest in things you normally enjoy please talk to your healthcare provider about being screened for depression. 7. Dental visit- Brush and floss your teeth twice daily; visit your dentist twice a year. 8. Eye doctor- Get an eye exam at  least every 2 years. 9. Helmet use- Always wear a helmet when riding a bicycle, motorcycle, rollerblading or skateboarding. 42. Safe sex- If you may be exposed to sexually transmitted infections, use a condom. 11. Seat belts- Seat belts can save your live; always wear one. 12. Smoke/Carbon Monoxide detectors- These detectors need to  be installed on the appropriate level of your home. Replace batteries at least once a year. 13. Skin cancer- When out in the sun please cover up and use sunscreen 15 SPF or higher. 14. Violence- If anyone is threatening or hurting you, please tell your healthcare provider.

## 2016-08-09 LAB — COMPREHENSIVE METABOLIC PANEL
ALBUMIN: 3.9 g/dL (ref 3.5–5.5)
ALT: 13 IU/L (ref 0–32)
AST: 15 IU/L (ref 0–40)
Albumin/Globulin Ratio: 1.3 (ref 1.2–2.2)
Alkaline Phosphatase: 81 IU/L (ref 39–117)
BUN / CREAT RATIO: 14 (ref 9–23)
BUN: 12 mg/dL (ref 6–24)
Bilirubin Total: 0.3 mg/dL (ref 0.0–1.2)
CO2: 29 mmol/L (ref 18–29)
CREATININE: 0.85 mg/dL (ref 0.57–1.00)
Calcium: 9.3 mg/dL (ref 8.7–10.2)
Chloride: 102 mmol/L (ref 96–106)
GFR calc non Af Amer: 82 mL/min/{1.73_m2} (ref >59)
GFR, EST AFRICAN AMERICAN: 95 mL/min/{1.73_m2} (ref >59)
GLOBULIN, TOTAL: 2.9 g/dL (ref 1.5–4.5)
GLUCOSE: 81 mg/dL (ref 65–99)
Potassium: 4.1 mmol/L (ref 3.5–5.2)
Sodium: 142 mmol/L (ref 134–144)
TOTAL PROTEIN: 6.8 g/dL (ref 6.0–8.5)

## 2016-08-09 LAB — CBC WITH DIFFERENTIAL/PLATELET
Basophils Absolute: 0 10*3/uL (ref 0.0–0.2)
Basos: 0 %
EOS (ABSOLUTE): 0.1 10*3/uL (ref 0.0–0.4)
EOS: 1 %
HEMATOCRIT: 36.6 % (ref 34.0–46.6)
HEMOGLOBIN: 12.1 g/dL (ref 11.1–15.9)
IMMATURE GRANS (ABS): 0 10*3/uL (ref 0.0–0.1)
Immature Granulocytes: 0 %
LYMPHS ABS: 3.3 10*3/uL — AB (ref 0.7–3.1)
LYMPHS: 42 %
MCH: 26.4 pg — AB (ref 26.6–33.0)
MCHC: 33.1 g/dL (ref 31.5–35.7)
MCV: 80 fL (ref 79–97)
MONOCYTES: 8 %
Monocytes Absolute: 0.6 10*3/uL (ref 0.1–0.9)
NEUTROS ABS: 3.7 10*3/uL (ref 1.4–7.0)
Neutrophils: 49 %
Platelets: 279 10*3/uL (ref 150–379)
RBC: 4.58 x10E6/uL (ref 3.77–5.28)
RDW: 15.4 % (ref 12.3–15.4)
WBC: 7.7 10*3/uL (ref 3.4–10.8)

## 2016-08-09 LAB — LIPID PANEL
CHOL/HDL RATIO: 2.8 ratio (ref 0.0–4.4)
Cholesterol, Total: 130 mg/dL (ref 100–199)
HDL: 46 mg/dL (ref >39)
LDL CALC: 60 mg/dL (ref 0–99)
TRIGLYCERIDES: 119 mg/dL (ref 0–149)
VLDL CHOLESTEROL CAL: 24 mg/dL (ref 5–40)

## 2016-08-09 LAB — TSH: TSH: 1.77 u[IU]/mL (ref 0.450–4.500)

## 2016-08-09 LAB — HEMOGLOBIN A1C
Est. average glucose Bld gHb Est-mCnc: 117 mg/dL
Hgb A1c MFr Bld: 5.7 % — ABNORMAL HIGH (ref 4.8–5.6)

## 2016-08-11 LAB — PAP IG AND HPV HIGH-RISK
HPV, HIGH-RISK: NEGATIVE
PAP Smear Comment: 0

## 2016-08-13 ENCOUNTER — Other Ambulatory Visit: Payer: Self-pay | Admitting: Family Medicine

## 2016-08-13 DIAGNOSIS — Z1231 Encounter for screening mammogram for malignant neoplasm of breast: Secondary | ICD-10-CM

## 2016-08-20 ENCOUNTER — Telehealth: Payer: Self-pay

## 2016-08-20 NOTE — Telephone Encounter (Signed)
Patient had lab work done on 08/08/16 and has not gotten the results yet and she would like to have someone give her a call ASAP.    Her number is [phone]

## 2016-08-20 NOTE — Telephone Encounter (Signed)
Please see labs, nl but hga1c a little elevated and advise

## 2016-08-27 NOTE — Telephone Encounter (Signed)
Call --- 1. Pap smear is normal. 2 . Labs are normal. 3.  Urine is normal. Mail copy of labs to patient.

## 2016-08-28 NOTE — Telephone Encounter (Signed)
No v/m couldnot leave message

## 2016-09-03 NOTE — Telephone Encounter (Signed)
Mailed to pt

## 2016-09-07 ENCOUNTER — Telehealth: Payer: Self-pay | Admitting: Family Medicine

## 2016-09-07 NOTE — Telephone Encounter (Signed)
Pt dropped off form to be completed by provider for annual CPE. Please sign and fax  [phone]  PLEASE ADVISE

## 2016-09-07 NOTE — Telephone Encounter (Signed)
CPE form needs completing .  Carroll Sage. Kenton Kingfisher, MSN, FNP-C [facility] [place]

## 2016-09-11 NOTE — Telephone Encounter (Signed)
Form completed.  Please call and advise pt that form will be faxed to employer.

## 2016-09-13 ENCOUNTER — Other Ambulatory Visit: Payer: Self-pay | Admitting: Family Medicine

## 2016-09-14 NOTE — Telephone Encounter (Signed)
I faxed her CPE form to her employer on 09/14/16. I got a confirmation.  I tried to leave a message, she doesn't have her VM set up.

## 2016-10-11 ENCOUNTER — Ambulatory Visit
Admission: RE | Admit: 2016-10-11 | Discharge: 2016-10-11 | Disposition: A | Discharge status: Home / Self Care | Payer: 59 | Source: Ambulatory Visit | Attending: Family Medicine | Admitting: Family Medicine

## 2016-10-11 DIAGNOSIS — Z1231 Encounter for screening mammogram for malignant neoplasm of breast: Secondary | ICD-10-CM

## 2016-11-14 ENCOUNTER — Ambulatory Visit: Payer: 59 | Admitting: Family Medicine

## 2016-12-11 ENCOUNTER — Encounter: Payer: Self-pay | Admitting: Family Medicine

## 2016-12-11 ENCOUNTER — Ambulatory Visit (INDEPENDENT_AMBULATORY_CARE_PROVIDER_SITE_OTHER): Payer: 59 | Admitting: Family Medicine

## 2016-12-11 ENCOUNTER — Ambulatory Visit (INDEPENDENT_AMBULATORY_CARE_PROVIDER_SITE_OTHER): Payer: 59

## 2016-12-11 VITALS — BP 145/91 | HR 97 | Temp 98.5°F | Resp 18 | Ht 64.57 in | Wt 262.4 lb

## 2016-12-11 DIAGNOSIS — M25561 Pain in right knee: Secondary | ICD-10-CM

## 2016-12-11 DIAGNOSIS — M25562 Pain in left knee: Secondary | ICD-10-CM

## 2016-12-11 DIAGNOSIS — Z6841 Body Mass Index (BMI) 40.0 and over, adult: Secondary | ICD-10-CM

## 2016-12-11 DIAGNOSIS — M5136 Other intervertebral disc degeneration, lumbar region: Secondary | ICD-10-CM

## 2016-12-11 MED ORDER — MELOXICAM 15 MG PO TABS: 15.0000 mg | ORAL_TABLET | Freq: Every day | ORAL | 3 refills | Status: DC

## 2016-12-11 NOTE — Patient Instructions (Addendum)
[email or web address]   IF you received an x-ray today, you will receive an invoice from [facility]. Please contact [facility] at [phone] with questions or concerns regarding your invoice.   IF you received labwork today, you will receive an invoice from [facility]. Please contact [facility] at [phone] with questions or concerns regarding your invoice.   Our billing staff will not be able to assist you with questions regarding bills from these companies.  You will be contacted with the lab results as soon as they are available. The fastest way to get your results is to activate your My Chart account. Instructions are located on the last page of this paperwork. If you have not heard from [place] regarding the results in 2 weeks, please contact this office.      Patellofemoral Pain Syndrome Patellofemoral pain syndrome is a condition that involves a softening or breakdown of the tissue (cartilage) on the underside of your kneecap (patella). This causes pain in the front of the knee. The condition is also called runner's knee or chondromalacia patella. Patellofemoral pain syndrome is most common in young adults who are active in sports. Your knee is the largest joint in your body. The patella covers the front of your knee and is attached to muscles above and below your knee. The underside of the patella is covered with a smooth type of cartilage (synovium). The smooth surface helps the patella glide easily when you move your knee. Patellofemoral pain syndrome causes swelling in the joint linings and bone surfaces in your knee. What are the causes? Patellofemoral pain syndrome can be caused by:  Overuse.  Poor alignment of your knee joints.  Weak leg muscles.  A direct blow to your kneecap.  What increases the risk? You may be at risk for patellofemoral pain syndrome if you:  Do a lot of activities that can wear down your kneecap. These  include: ? Running. ? Squatting. ? Climbing stairs.  Start a new physical activity or exercise program.  Wear shoes that do not fit well.  Do not have good leg strength.  Are overweight.  What are the signs or symptoms? Knee pain is the most common symptom of patellofemoral pain syndrome. This may feel like a dull, aching pain underneath your patella, in the front of your knee. There may be a popping or cracking sound when you move your knee. Pain may get worse with:  Exercise.  Climbing stairs.  Running.  Jumping.  Squatting.  Kneeling.  Sitting for a long time.  Moving or pushing on your patella.  How is this diagnosed? Your health care provider may be able to diagnose patellofemoral pain syndrome from your symptoms and medical history. You may be asked about your recent physical activities and which ones cause knee pain. Your health care provider may do a physical exam with certain tests to confirm the diagnosis. These may include:  Moving your patella back and forth.  Checking your range of knee motion.  Having you squat or jump to see if you have pain.  Checking the strength of your leg muscles.  An MRI of the knee may also be done. How is this treated? Patellofemoral pain syndrome can usually be treated at home with rest, ice, compression, and elevation (RICE). Other treatments may include:  Nonsteroidal anti-inflammatory drugs (NSAIDs).  Physical therapy to stretch and strengthen your leg muscles.  Shoe inserts (orthotics) to take stress off your knee.  A knee brace or knee support.  Surgery to  remove damaged cartilage or move the patella to a better position. The need for surgery is rare.  Follow these instructions at home:  Take medicines only as directed by your health care provider.  Rest your knee. ? When resting, keep your knee raised above the level of your heart. ? Avoid activities that cause knee pain.  Apply ice to the injured  area: ? Put ice in a plastic bag. ? Place a towel between your skin and the bag. ? Leave the ice on for 20 minutes, 2-3 times a day.  Use splints, braces, knee supports, or walking aids as directed by your health care provider.  Perform stretching and strengthening exercises as directed by your health care provider or physical therapist.  Keep all follow-up visits as directed by your health care provider. This is important. Contact a health care provider if:  Your symptoms get worse.  You are not improving with home care. This information is not intended to replace advice given to you by your health care provider. Make sure you discuss any questions you have with your health care provider. Document Released: 06/13/2009 Document Revised: 12/01/2015 Document Reviewed: 09/14/2013 Elsevier Interactive Patient Education  2018 [employer].

## 2016-12-11 NOTE — Progress Notes (Signed)
Subjective:    Patient ID: Sandra Jenkins, female    DOB: 1971/02/19, 46 y.o.   MRN: [id number]  12/11/2016  Arthritis (both kness and lower back) and Weight Loss   HPI This 46 y.o. female presents for evaluation of low back pain/arthritis and obesity.  Dr. Othelia Pulling diagnosed with arthritis.  Referred to spine specialist; Dr. Mina Marble; undergoes injections in lower back; last injection performed April 2018.   S/p xrays of knees +arthritis.  Having B knee pain.  No knee swelling just moreso going up and down stairs; walking.  Trying to exercise more.  +giving out R knee; duration of R knee pain for several years.  Was going to another provider under a different insurance at [facility]; diagnosed arthritis.    No orthopedic consultation at that time.  No physical therapy for knees or back.  No medication for knees or lower back .  MRI Lumbar spine from 12/12/2015: IMPRESSION: L5-S1: Chronic degenerative disc disease. Desiccation of the disc. Annular fissures with annular bulging. Chronic discogenic marrow changes. Facet arthropathy with facet effusions. These findings could certainly relate to back pain.  L4-5: Facet arthropathy with gaping, fluid-filled facet joints. There may be abnormal motion with standing or flexion. Only minimal disc desiccation however. No stenosis. These findings could certainly relate to back pain.   Electronically Signed   By: Nelson Chimes M.D.   On: 12/12/2015 20:50  Obesity: requesting referral to a bariatric surgeon.  Would like to see options.  Weight has been steady for past five years; ranging 220-280.  This is heaviest; usually at 240 range.  Husband has [employer].  Exercising 4 days per week; walking around track at daughter's school for 45 minutes. No specific weight loss program; limiting intake; eating smaller portions. Drinking water during the day with lemon and fruit.   No skipping meals during the day. Recently took Phentermine  prescribed last month; suffered with insomnia.  Was effective yet did not like side effects.  Went to [employer]    Immunization History  Administered Date(s) Administered  . Tdap 08/08/2016   BP Readings from Last 3 Encounters:  12/11/16 (!) 145/91  08/08/16 124/78  10/08/15 110/75   Wt Readings from Last 3 Encounters:  12/11/16 262 lb 6.4 oz (119 kg)  08/08/16 276 lb (125.2 kg)  10/08/15 272 lb 4 oz (123.5 kg)    Review of Systems  Constitutional: Negative for chills, diaphoresis, fatigue and fever.  Eyes: Negative for visual disturbance.  Respiratory: Negative for cough and shortness of breath.   Cardiovascular: Negative for chest pain, palpitations and leg swelling.  Gastrointestinal: Negative for abdominal pain, constipation, diarrhea, nausea and vomiting.  Endocrine: Negative for cold intolerance, heat intolerance, polydipsia, polyphagia and polyuria.  Musculoskeletal: Positive for arthralgias, back pain, gait problem and joint swelling.  Neurological: Negative for dizziness, tremors, seizures, syncope, facial asymmetry, speech difficulty, weakness, light-headedness, numbness and headaches.    Past Medical History:  Diagnosis Date  . Anemia    Sickle Cell Trait  . Arthritis    BILATERAL KNEES; DDD lumbar spine   Past Surgical History:  Procedure Laterality Date  . BREAST SURGERY     breast reduction  . CESAREAN SECTION    . COSMETIC SURGERY    . REDUCTION MAMMAPLASTY Bilateral 2004  . TUBAL LIGATION     No Known Allergies  Social History   Social History  . Marital status: Married    Spouse name: N/A  . Number of  children: 2  . Years of education: N/A   Occupational History  . [place]    customer service   Social History Main Topics  . Smoking status: Never Smoker  . Smokeless tobacco: Never Used  . Alcohol use 1.2 - 1.8 oz/week    2 - 3 Standard drinks or equivalent per week     Comment: 2/month  . Drug use: No  .  Sexual activity: Yes   Other Topics Concern  . 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      Tobacco: none      Alcohol: wine weekends      Exercise: walking; cannot get on elliptical or treadmill; three days per week for 30 minutes      Seatbelt: 100%; no texting.   Family History  Problem Relation Age of Onset  . Heart disease Maternal Grandmother   . Heart attack Maternal Grandmother   . Heart disease Maternal Grandfather   . Heart attack Maternal Grandfather   . Heart disease Paternal Grandmother   . Heart attack Paternal Grandmother   . Arthritis Father        Objective:    BP (!) 145/91 (BP Location: Right Arm, Patient Position: Sitting, Cuff Size: Large)   Pulse 97   Temp 98.5 F (36.9 C) (Oral)   Resp 18   Ht 5' 4.57" (1.64 m)   Wt 262 lb 6.4 oz (119 kg)   LMP 11/03/2016 (Approximate)   SpO2 98%   BMI 44.25 kg/m  Physical Exam  Constitutional: She is oriented to person, place, and time. She appears well-developed and well-nourished. No distress.  Morbidly obese.  HENT:  Head: Normocephalic and atraumatic.  Right Ear: External ear normal.  Left Ear: External ear normal.  Nose: Nose normal.  Mouth/Throat: Oropharynx is clear and moist.  Eyes: Conjunctivae and EOM are normal. Pupils are equal, round, and reactive to light.  Neck: Normal range of motion. Neck supple. Carotid bruit is not present. No thyromegaly present.  Cardiovascular: Normal rate, regular rhythm, normal heart sounds and intact distal pulses.  Exam reveals no gallop and no friction rub.   No murmur heard. Pulmonary/Chest: Effort normal and breath sounds normal. She has no wheezes. She has no rales.  Musculoskeletal:       Right knee: She exhibits decreased range  of motion. She exhibits no swelling and no effusion. Tenderness found. Medial joint line tenderness noted. No lateral joint line tenderness noted.       Left knee: She exhibits normal range of motion, no swelling and no effusion. Tenderness found. Medial joint line tenderness noted. No lateral joint line and no patellar tendon tenderness noted.       Lumbar back: She exhibits decreased range of motion, pain and spasm. She exhibits no tenderness, no bony tenderness and normal pulse.  Lymphadenopathy:    She has no cervical adenopathy.  Neurological: She is alert and oriented to person, place, and time. No cranial nerve deficit.  Skin: Skin is warm and dry. No rash noted. She is not diaphoretic. No erythema. No pallor.  Psychiatric: She has a normal mood and affect. Her behavior is normal.  Assessment & Plan:   1. Degenerative disc disease, lumbar   2. Acute pain of both knees   3. Class 3 severe obesity due to excess calories with serious comorbidity and body mass index (BMI) of 40.0 to 44.9 in adult [facility])    -chronic DDD lumbar spine; receiving injections from ortho. -chronic B knee pain with recent acute worsening; obtain knee xrays; rx for Mobic provided to take daily for 2-4 weeks and then PRN; home exercise program provided. If no improvement in one month, call for ortho referral. -considering gastric bypass for weight loss; refer to bariatric general surgeon in area; discussed exercise and dietary modification with goal of 1500 kcal per day.   Will plan to follow every month while awaiting consultation; recommend attending free bariatric seminar.  Recommend exercise 5 days per week for 30 minutes minimum.   Orders Placed This Encounter  Procedures  . DG Knee Complete 4 Views Right    Standing Status:   Future    Standing Expiration Date:   12/11/2017    Order Specific Question:   Reason for Exam (SYMPTOM  OR DIAGNOSIS REQUIRED)    Answer:   R knee pain under patella; giving out.     Order Specific Question:   Is the patient pregnant?    Answer:   No    Comments:   BTL    Order Specific Question:   Preferred imaging location?    Answer:   External  . Ambulatory referral to General Surgery    Referral Priority:   Routine    Referral Type:   Surgical    Referral Reason:   Specialty Services Required    Requested Specialty:   General Surgery    Number of Visits Requested:   1   Meds ordered this encounter  Medications  . meloxicam (MOBIC) 15 MG tablet    Sig: Take 1 tablet (15 mg total) by mouth daily.    Dispense:  30 tablet    Refill:  3  . phentermine (ADIPEX-P) 37.5 MG tablet    Sig: TK 1 T PO D    Refill:  0    Return in about 4 weeks (around 01/08/2017) for recheck weight, knee pain.   Vitoria Conyer Elayne Guerin, M.D. Primary Care at [facility] previously [facility] [place] [address] [place], [place]  [address] [phone] phone [phone] fax

## 2017-01-01 IMAGING — MR MR LUMBAR SPINE W/O CM
4 of 5 series · 19 of 48 positions shown · non-contrast
Comparison: None.

CLINICAL DATA: Low back pain, 7 years duration, worsening recently.

EXAM:
MRI LUMBAR SPINE WITHOUT CONTRAST
TECHNIQUE: Multiplanar, multisequence MR imaging of the lumbar spine was
performed. No intravenous contrast was administered.

[Series 6: T2 · sagittal · 4.0mm · 0.73mm/px · 7 of 15 slices shown (1 of 2)]
[im 1/15]
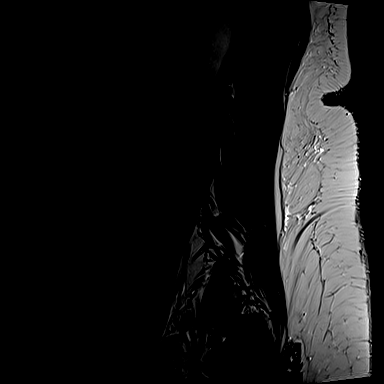
[im 3/15]
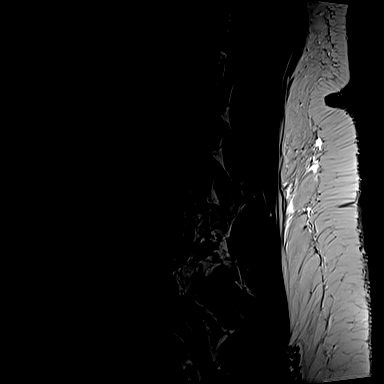
[im 5/15]
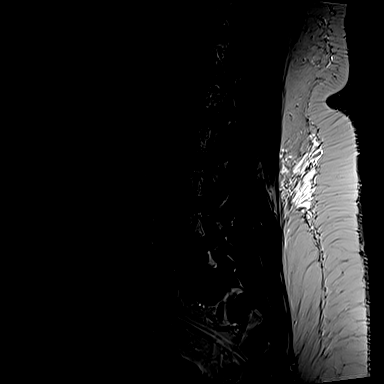
[im 8/15]
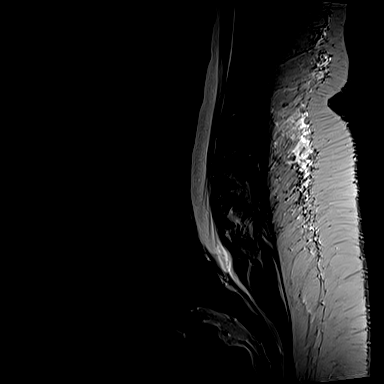
[im 10/15]
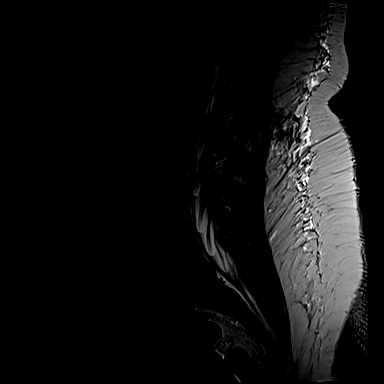
[im 12/15]
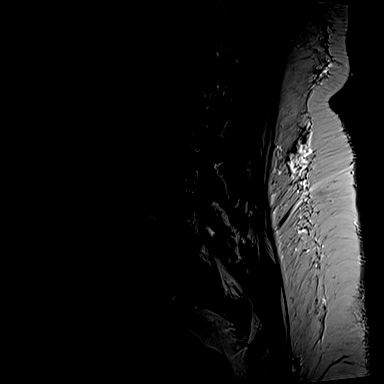
[im 15/15]
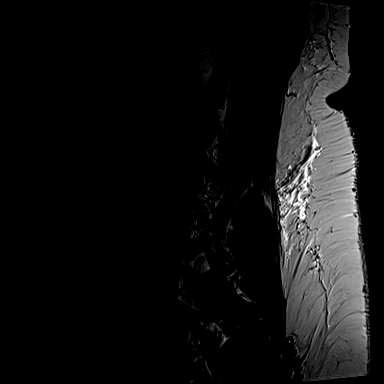

[Series 7: T1 · sagittal · 4.0mm · 0.73mm/px · 3 of 15 slices shown (1 of 2)]
[im 3/15]
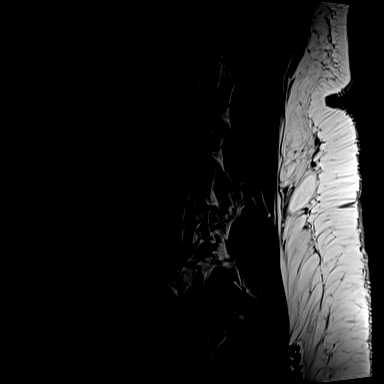
[im 8/15]
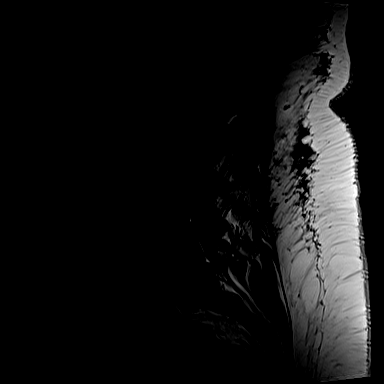
[im 12/15]
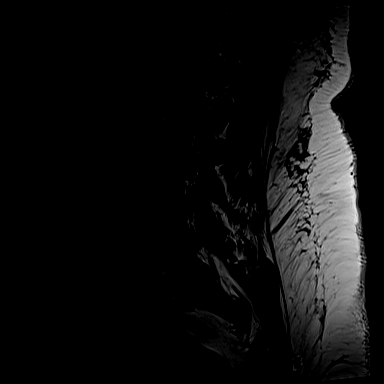

[Series 11: T1 · axial · 4.0mm · 0.28mm/px · z∈[+108,+237]mm · 3 of 34 slices shown (2 of 2)]
[im 6/34]
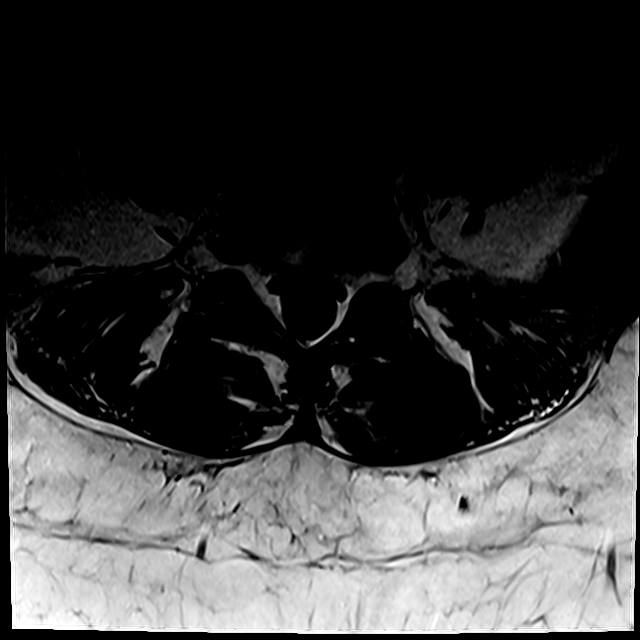
[im 18/34]
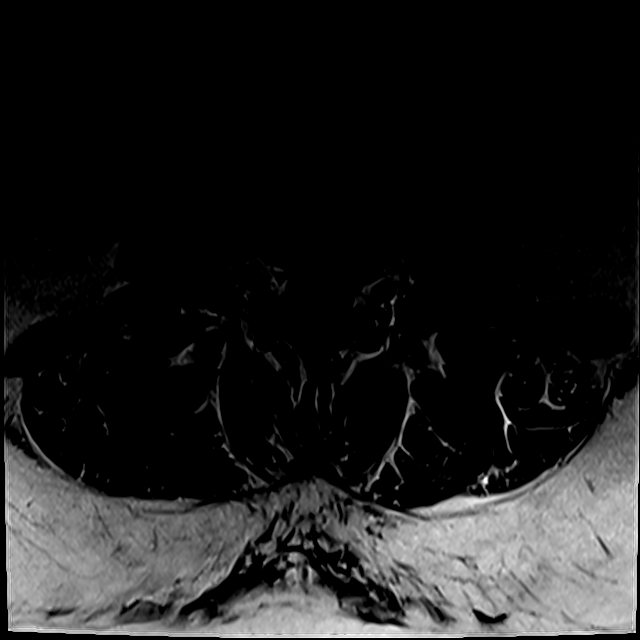
[im 28/34]
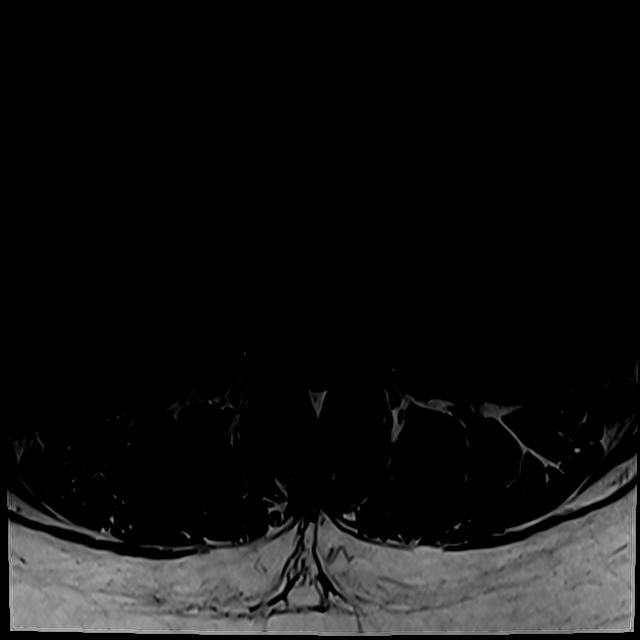

[Series 14: T2 · axial · 4.0mm · 0.28mm/px · z∈[+83,+237]mm · 6 of 34 slices shown (2 of 2)]
[im 1/34]
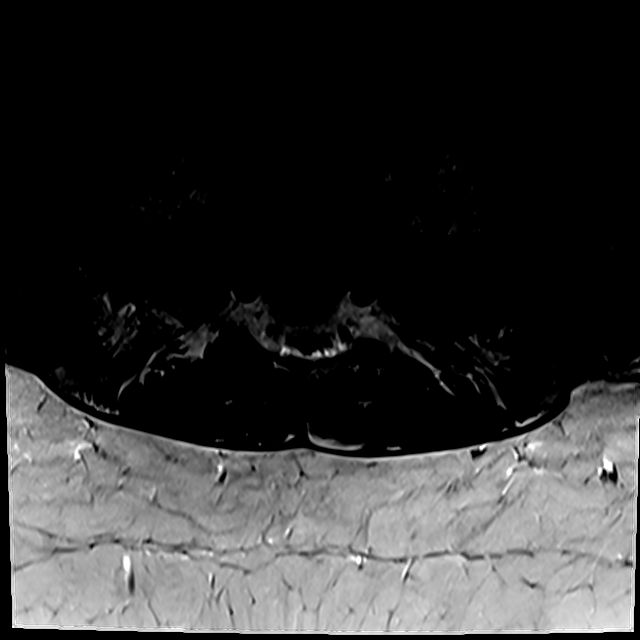
[im 6/34]
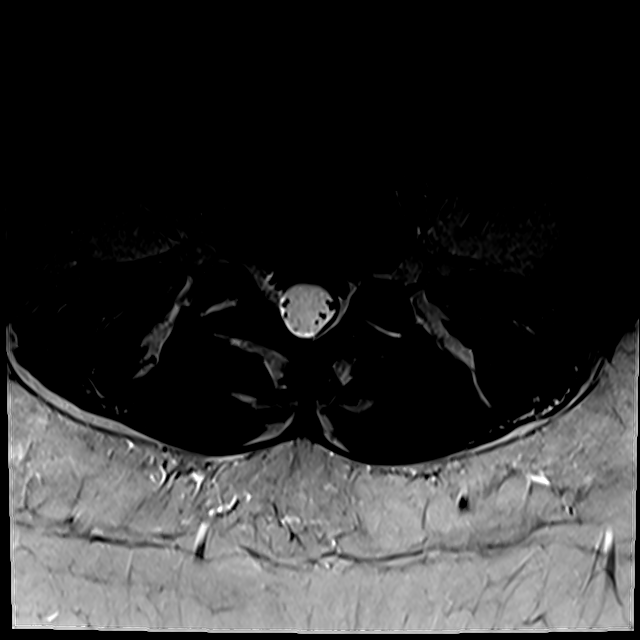
[im 11/34]
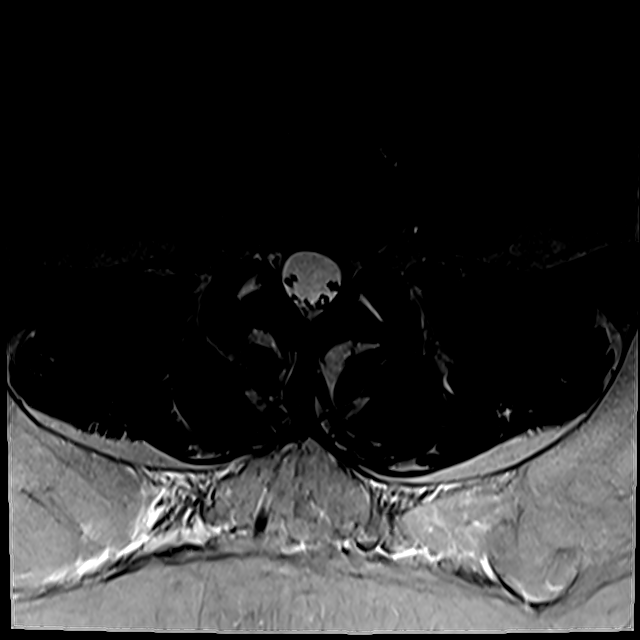
[im 16/34]
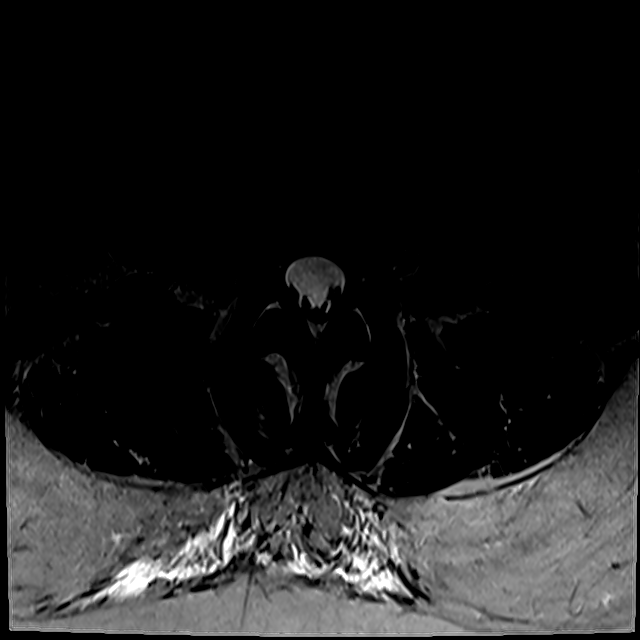
[im 18/34]
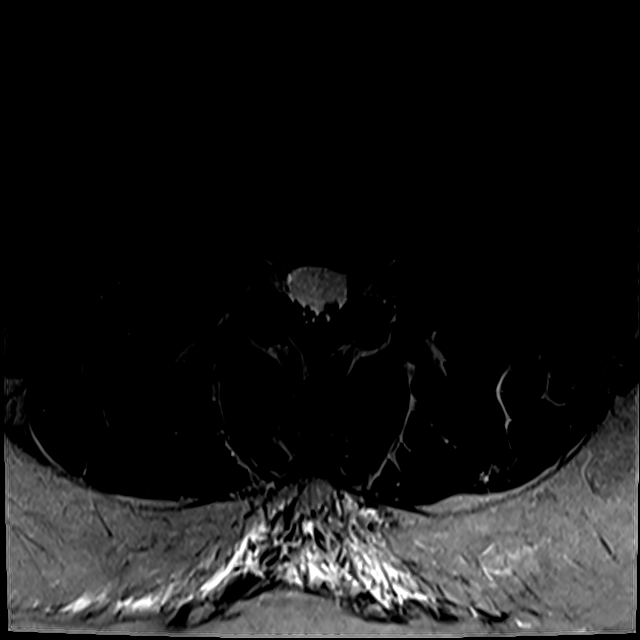
[im 28/34]
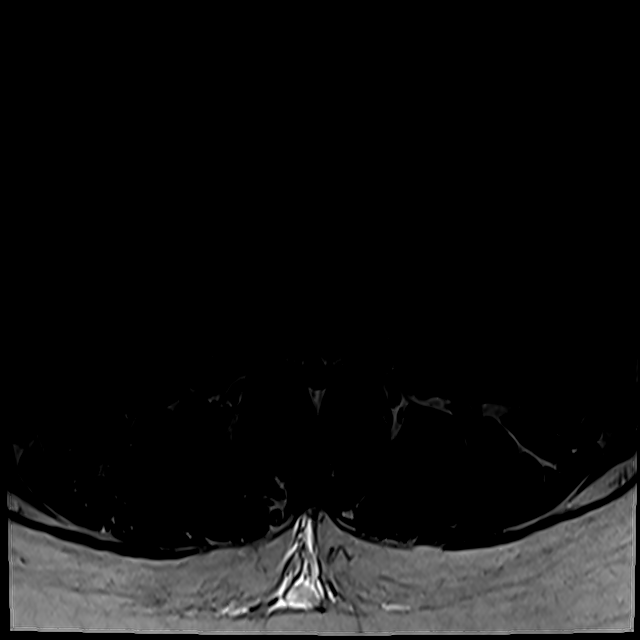

[19 of 48 positions shown; findings below may reference images not displayed]

FINDINGS: Segmentation:  5 lumbar type vertebral bodies is not.

Alignment:  Normal

Vertebrae:  No primary bone lesion.

Conus medullaris: Extends to the L1 level and appears normal.

Paraspinal and other soft tissues: No significant finding.

Disc levels: T12-L1, L1-2 and L2-3 are normal.

L3-4: No disc pathology. Mild facet arthritis with small facet
effusions. No stenosis.

L4-5: Bilateral facet arthropathy with gaping, fluid-filled facet
joints. This would likely allow abnormal motion with standing or
flexion. The disc shows minimal degenerative change with minimal
desiccation but no bulge or herniation. No stenosis or neural
compression.

L5-S1: Disc degeneration with desiccation of the disc, annular
fissures and mild annular bulging. No stenosis or neural
compression. Chronic discogenic marrow change within L5 and S1 which
can be associated with back pain. Bilateral facet arthropathy at
L5-S1 with fluid-filled joints that could allow mild motion with
standing or flexion.
IMPRESSION: L5-S1: Chronic degenerative disc disease. Desiccation of the disc.
Annular fissures with annular bulging. Chronic discogenic marrow
changes. Facet arthropathy with facet effusions. These findings
could certainly relate to back pain.

L4-5: Facet arthropathy with gaping, fluid-filled facet joints.
There may be abnormal motion with standing or flexion. Only minimal
disc desiccation however. No stenosis. These findings could
certainly relate to back pain.

## 2017-01-29 ENCOUNTER — Encounter: Payer: Self-pay | Admitting: Family Medicine

## 2017-01-29 ENCOUNTER — Ambulatory Visit (INDEPENDENT_AMBULATORY_CARE_PROVIDER_SITE_OTHER): Payer: 59 | Admitting: Family Medicine

## 2017-01-29 VITALS — BP 113/80 | HR 82 | Temp 98.6°F | Resp 18 | Ht 64.57 in | Wt 260.0 lb

## 2017-01-29 DIAGNOSIS — M1711 Unilateral primary osteoarthritis, right knee: Secondary | ICD-10-CM

## 2017-01-29 DIAGNOSIS — M51369 Other intervertebral disc degeneration, lumbar region without mention of lumbar back pain or lower extremity pain: Secondary | ICD-10-CM | POA: Insufficient documentation

## 2017-01-29 DIAGNOSIS — M5136 Other intervertebral disc degeneration, lumbar region: Secondary | ICD-10-CM | POA: Diagnosis not present

## 2017-01-29 NOTE — Patient Instructions (Addendum)
  20 grams of protein per meal; goal of 60 grams of protein per day.    Try Slim Fast high protein shakes (20 grams of protein per shake).    1500-1800 calories per day.   https://www.matthews.info/ is free APP.    Go to [facility] Bariatric Surgery free seminars for the upcoming months.   IF you received an x-ray today, you will receive an invoice from [facility]. Please contact [facility] at [phone] with questions or concerns regarding your invoice.   IF you received labwork today, you will receive an invoice from [facility]. Please contact [facility] at [phone] with questions or concerns regarding your invoice.   Our billing staff will not be able to assist you with questions regarding bills from these companies.  You will be contacted with the lab results as soon as they are available. The fastest way to get your results is to activate your My Chart account. Instructions are located on the last page of this paperwork. If you have not heard from [place] regarding the results in 2 weeks, please contact this office.

## 2017-01-29 NOTE — Progress Notes (Signed)
Subjective:    Patient ID: Sandra Jenkins, female    DOB: 16-Mar-1971, 46 y.o.   MRN: [id number]  01/29/2017  Weight Loss and Knee Pain (both knees )   HPI This 46 y.o. female presents for evaluation of osteoarthritis RIGHT knee and ongoing knee pain. Also presenting to discuss weight loss options.  S/p xray at last visit one month ago that revealed moderate to severe arthritic changes.    Fall: fell down a flight of stairs one month ago. Still having R buttocks pain; really sore.  No head trauma.  Landed on buttocks R.  Large bruise; laughed really hard.  No radiation into R leg; had granddaughter with her.  Trying to put things in washer; turning lights off.  Has not been contacted regarding referral.   Has not attended bariatric seminar.  Unable to exercise much due to pain in knees.  Trying to walk.  Was doing four days per week.  Likes to walk exercise.  Daughter goes to [place]; walks on track; gravel.  Walks 30 minutes.   Eliminated juice and sweet tea.   B: none this morning; Kell[employer]'s vanilla almond cereal, 2% milk Snack: none L: leftovers (grilled chicken salad at [employer] , water) S: none Supper: 1 hot dogs, fries (moving right now) 6 or 7; ketchup and mustard; water Snack: none or popcorn, crystal light  Not ready to see orthopedist for R knee osteoarthritis.    DDD lumbar: horrible pain in lower back.  No medication right now for lower back.  Orthopedist will not complete FMLA for low back.  Will having stabbing nerve pain; [facility] at [employer].      BP Readings from Last 3 Encounters:  01/29/17 113/80  12/11/16 (!) 145/91  08/08/16 124/78   Wt Readings from Last 3 Encounters:  01/29/17 260 lb (117.9 kg)  12/11/16 262 lb 6.4 oz (119 kg)  08/08/16 276 lb (125.2 kg)   Immunization History  Administered Date(s) Administered  . Tdap 08/08/2016     Review of Systems  Constitutional: Negative for chills, diaphoresis, fatigue and fever.  Eyes: Negative  for visual disturbance.  Respiratory: Negative for cough and shortness of breath.   Cardiovascular: Negative for chest pain, palpitations and leg swelling.  Gastrointestinal: Negative for abdominal pain, constipation, diarrhea, nausea and vomiting.  Endocrine: Negative for cold intolerance, heat intolerance, polydipsia, polyphagia and polyuria.  Musculoskeletal: Positive for arthralgias, back pain, gait problem and joint swelling.  Neurological: Negative for dizziness, tremors, seizures, syncope, facial asymmetry, speech difficulty, weakness, light-headedness, numbness and headaches.    Past Medical History:  Diagnosis Date  . Anemia    Sickle Cell Trait  . Arthritis    BILATERAL KNEES; DDD lumbar spine   Past Surgical History:  Procedure Laterality Date  . BREAST SURGERY     breast reduction  . CESAREAN SECTION    . COSMETIC SURGERY    . REDUCTION MAMMAPLASTY Bilateral 2004  . TUBAL LIGATION     No Known Allergies Current Outpatient Prescriptions  Medication Sig Dispense Refill  . meloxicam (MOBIC) 15 MG tablet Take 1 tablet (15 mg total) by mouth daily. 30 tablet 3   No current facility-administered medications for this visit.    Social History   Social History  . Marital status: Married    Spouse name: N/A  . Number of children: 2  . Years of education: N/A   Occupational History  . [employer]  Social History Main Topics  . Smoking status: Never Smoker  . Smokeless tobacco: Never Used  . Alcohol use 1.2 - 1.8 oz/week    2 - 3 Standard drinks or equivalent per week     Comment: 2/month  . Drug use: No  . Sexual activity: Yes   Other Topics Concern  . 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      Tobacco: none      Alcohol: wine weekends      Exercise: walking; cannot get on elliptical or treadmill; three days per week for 30 minutes      Seatbelt: 100%; no texting.   Family History  Problem Relation Age of Onset  . Heart disease Maternal Grandmother   . Heart attack Maternal Grandmother   . Heart disease Maternal Grandfather   . Heart attack Maternal Grandfather   . Heart disease Paternal Grandmother   . Heart attack Paternal Grandmother   . Arthritis Father        Objective:    BP 113/80   Pulse 82   Temp 98.6 F (37 C) (Oral)   Resp 18   Ht 5' 4.57" (1.64 m)   Wt 260 lb (117.9 kg)   LMP 01/15/2017 (Approximate)   SpO2 99%   BMI 43.85 kg/m  Physical Exam  Constitutional: She is oriented to person, place, and time. She appears well-developed and well-nourished. No distress.  HENT:  Head: Normocephalic and atraumatic.  Right Ear: External ear normal.  Left Ear: External ear normal.  Nose: Nose normal.  Mouth/Throat: Oropharynx is clear and moist.  Eyes: Pupils are equal, round, and reactive to light. Conjunctivae and EOM are normal.  Neck: Normal range of motion. Neck supple. Carotid bruit is not present. No thyromegaly present.  Cardiovascular: Normal rate, regular rhythm, normal heart sounds and intact distal pulses.  Exam reveals no gallop and no friction rub.   No murmur heard. Pulmonary/Chest: Effort normal and breath sounds normal. She has no wheezes. She has no rales.  Abdominal: Soft. Bowel sounds are normal. She exhibits no distension and no mass. There is no tenderness. There is no rebound and no guarding.  Musculoskeletal:       Right knee: She exhibits decreased range of motion. She exhibits no swelling and no effusion. Tenderness found. Medial joint line tenderness noted. No lateral joint line, no MCL and no LCL tenderness noted.       Lumbar back: She exhibits tenderness and pain. She exhibits no spasm and normal pulse.        Back:  +TTP R buttocks region without induration or ecchymoses.  Lymphadenopathy:    She has no cervical adenopathy.  Neurological: She is alert and oriented to person, place, and time. No cranial nerve deficit.  Skin: Skin is warm and dry. No rash noted. She is not diaphoretic. No erythema. No pallor.  Psychiatric: She has a normal mood and affect. Her behavior is normal.   Depression screen [facility] [place] 2/9 01/29/2017 12/11/2016 08/08/2016 10/08/2015 07/25/2015  Decreased Interest 0 0 0 0 0  Down, Depressed, Hopeless 0 0 0 0 0  PHQ - 2 Score 0 0 0 0 0   Fall Risk  01/29/2017 12/11/2016 08/08/2016  Falls in the past year? Yes No No  Number falls in past yr: 1 - -  Injury with Fall? No - -        Assessment & Plan:   1. Primary osteoarthritis of right knee   2. Degenerative disc disease, lumbar   3. Morbid obesity ([place])    -persistent pain in RIGHT knee despite Meloxicam; recommend follow-up with ortho if worsens or persists.  Pt does not desire surgical intervention for OA knee or DDD lumbar spine; pt desires weight loss for pain management. --recommend weight loss, exercise for 30-60 minutes five days per week; recommend 1500 kcal restriction per day with a minimum of 60 grams of protein per day. -refer for medical weight management at this time; congratulations on eliminating sweetened beverages.   -recommend https://www.matthews.info/ -also recommend attending Bariatric Surgery seminar at [facility]. -prolonged face-to-face for 25 minutes with greater than 50% of time dedicated to counseling and coordination of care.   Orders Placed This Encounter  Procedures  . Amb Ref to Medical Weight Management    Referral Priority:   Routine    Referral Type:   Consultation    Referred to Provider:   Starlyn Skeans, MD    Number of Visits Requested:   1   No orders of the defined types were placed in this encounter.   Return in about 4 weeks (around 02/26/2017) for recheck.   Sandra Jenkins Elayne Guerin,  M.D. Primary Care at [facility] previously [facility] [place] [address] [place], [place]  [address] [phone] phone [phone] fax

## 2017-02-14 ENCOUNTER — Telehealth: Payer: Self-pay | Admitting: Family Medicine

## 2017-02-14 NOTE — Telephone Encounter (Signed)
See note below

## 2017-02-14 NOTE — Telephone Encounter (Signed)
Authorization received from [facility] for pt to see Dr. Gurney Maxin at [facility]. Authorization number is [id number] and is valid for 6 months from 02/14/17-08/13/17 or for 6 visits. A copy of this authorization number has been faxed to [facility].

## 2017-02-20 ENCOUNTER — Other Ambulatory Visit (HOSPITAL_COMMUNITY): Payer: Self-pay | Admitting: General Surgery

## 2017-03-06 ENCOUNTER — Ambulatory Visit: Payer: 59 | Admitting: Registered"

## 2017-03-08 ENCOUNTER — Ambulatory Visit (INDEPENDENT_AMBULATORY_CARE_PROVIDER_SITE_OTHER): Payer: 59 | Admitting: Family Medicine

## 2017-03-08 ENCOUNTER — Ambulatory Visit (HOSPITAL_COMMUNITY)
Admission: RE | Admit: 2017-03-08 | Discharge: 2017-03-08 | Disposition: A | Discharge status: Home / Self Care | Payer: 59 | Source: Ambulatory Visit | Attending: General Surgery | Admitting: General Surgery

## 2017-03-08 ENCOUNTER — Other Ambulatory Visit: Payer: Self-pay

## 2017-03-08 ENCOUNTER — Encounter: Payer: Self-pay | Admitting: Family Medicine

## 2017-03-08 VITALS — BP 112/74 | HR 92 | Temp 99.3°F | Resp 16 | Ht 64.0 in | Wt 261.6 lb

## 2017-03-08 DIAGNOSIS — K219 Gastro-esophageal reflux disease without esophagitis: Secondary | ICD-10-CM | POA: Diagnosis not present

## 2017-03-08 DIAGNOSIS — Z01818 Encounter for other preprocedural examination: Secondary | ICD-10-CM | POA: Insufficient documentation

## 2017-03-08 DIAGNOSIS — M1711 Unilateral primary osteoarthritis, right knee: Secondary | ICD-10-CM | POA: Diagnosis not present

## 2017-03-08 DIAGNOSIS — M5136 Other intervertebral disc degeneration, lumbar region: Secondary | ICD-10-CM

## 2017-03-08 NOTE — Progress Notes (Signed)
Subjective:    Patient ID: Sandra Jenkins, female    DOB: September 21, 1970, 46 y.o.   MRN: [id number]  03/08/2017  Pre-op Exam (for Bariatirc surgery - per patient have to weigh each month)   HPI This 46 y.o. female presents for evaluation of OBESITY. Went to bariatric seminar at [facility] and then had appointment with general surgeon.  Discussed the SLEEVE; did not recommend the BAND.  Minimal health problems.  Went to do UGI showed minimal reflux.  No sleep study necessary.  Must do nutritional counseling; scheduled next week.  Needs psychology appointment on 03/23/17.   Assessment and plan at previous visit included: -persistent pain in RIGHT knee despite Meloxicam; recommend follow-up with ortho if worsens or persists.  Pt does not desire surgical intervention for OA knee or DDD lumbar spine; pt desires weight loss for pain management. --recommend weight loss, exercise for 30-60 minutes five days per week; recommend 1500 kcal restriction per day with a minimum of 60 grams of protein per day. -refer for medical weight management at this time; congratulations on eliminating sweetened beverages.   -recommend https://www.matthews.info/ -also recommend attending Bariatric Surgery seminar at [facility]. -prolonged face-to-face for 25 minutes with greater than 50% of time dedicated to counseling and coordination of care.   B:  Nothing today due to UGI; not a breakfast person. Snack: bowl of fruit, water Lunch: leftovers; salmon, pasta, water Snack: popcorn Supper: hamburger helper. Eating on normal plate.  Water Drinking 3 sixteen ounces of water.    Previously drank lots of water.    Walking sporadic lately; moved to an apartment.  Third floor apartment.  Walking up and down stairs all the time.  Has fit bit and calculating steps.  Includes water intake.    Surgery date before the end of the year; APP says 83 days away.  R knee OA:  S/p ortho consultation for knee pain.  No knee injections.   [place].  Lower back pain: no improvement with lower back pain.     BP Readings from Last 3 Encounters:  03/08/17 112/74  01/29/17 113/80  12/11/16 (!) 145/91   Wt Readings from Last 3 Encounters:  03/14/17 264 lb 6.4 oz (119.9 kg)  03/08/17 261 lb 9.6 oz (118.7 kg)  01/29/17 260 lb (117.9 kg)   Immunization History  Administered Date(s) Administered  . Tdap 08/08/2016    Review of Systems  Constitutional: Negative for chills, diaphoresis, fatigue and fever.  Eyes: Negative for visual disturbance.  Respiratory: Negative for cough and shortness of breath.   Cardiovascular: Negative for chest pain, palpitations and leg swelling.  Gastrointestinal: Negative for abdominal pain, constipation, diarrhea, nausea and vomiting.  Endocrine: Negative for cold intolerance, heat intolerance, polydipsia, polyphagia and polyuria.  Musculoskeletal: Positive for arthralgias and back pain.  Neurological: Negative for dizziness, tremors, seizures, syncope, facial asymmetry, speech difficulty, weakness, light-headedness, numbness and headaches.  Psychiatric/Behavioral: Negative for dysphoric mood. The patient is not nervous/anxious.     Past Medical History:  Diagnosis Date  . Anemia    Sickle Cell Trait  . Arthritis    BILATERAL KNEES; DDD lumbar spine   Past Surgical History:  Procedure Laterality Date  . BREAST SURGERY     breast reduction  . CESAREAN SECTION    . COSMETIC SURGERY    . REDUCTION MAMMAPLASTY Bilateral 2004  . TUBAL LIGATION     No Known Allergies Current Outpatient Prescriptions  Medication Sig Dispense Refill  . meloxicam (MOBIC) 15 MG  tablet Take 1 tablet (15 mg total) by mouth daily. 30 tablet 3   No current facility-administered medications for this visit.    Social History   Social History  . Marital status: Married    Spouse name: N/A  . Number of children: 2  . Years of education: N/A   Occupational History  . [employer]     customer service   Social History Main Topics  . Smoking status: Never Smoker  . Smokeless tobacco: Never Used  . Alcohol use 1.2 - 1.8 oz/week    2 - 3 Standard drinks or equivalent per week     Comment: 2/month  . Drug use: No  . Sexual activity: Yes   Other Topics Concern  . 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      Tobacco: none      Alcohol: wine weekends      Exercise: walking; cannot get on elliptical or treadmill; three days per week for 30 minutes      Seatbelt: 100%; no texting.   Family History  Problem Relation Age of Onset  . Heart disease Maternal Grandmother   . Heart attack Maternal Grandmother   . Heart disease Maternal Grandfather   . Heart attack Maternal Grandfather   . Heart disease Paternal Grandmother   . Heart attack Paternal Grandmother   . Arthritis Father        Objective:    BP 112/74 (BP Location: Right Arm, Patient Position: Sitting, Cuff Size: Large)   Pulse 92   Temp 99.3 F (37.4 C) (Oral)   Resp 16   Ht 5\' 4"  (1.626 m)   Wt 261 lb 9.6 oz (118.7 kg)   LMP 01/10/2017   SpO2 97%   BMI 44.90 kg/m  Physical Exam  Constitutional: She is oriented to person, place, and time. She appears well-developed and well-nourished. No distress.  HENT:  Head: Normocephalic and atraumatic.  Right Ear: External ear normal.  Left Ear: External ear normal.  Nose: Nose normal.  Mouth/Throat: Oropharynx is clear and moist.  Eyes: Pupils are equal, round, and reactive to light. Conjunctivae and EOM are normal.  Neck: Normal range of motion. Neck supple. Carotid bruit is not present. No thyromegaly present.  Cardiovascular: Normal rate, regular rhythm, normal heart sounds and intact distal pulses.  Exam reveals no  gallop and no friction rub.   No murmur heard. Pulmonary/Chest: Effort normal and breath sounds normal. She has no wheezes. She has no rales.  Abdominal: Soft. Bowel sounds are normal. She exhibits no distension and no mass. There is no tenderness. There is no rebound and no guarding.  Lymphadenopathy:    She has no cervical adenopathy.  Neurological: She is alert and oriented to person, place, and time. No cranial nerve deficit.  Skin: Skin is warm and dry. No rash noted. She is not diaphoretic. No erythema. No pallor.  Psychiatric: She has a normal mood and affect. Her behavior is normal.    No results found. Depression screen [facility] 2/9 03/08/2017 01/29/2017 12/11/2016 08/08/2016 10/08/2015  Decreased Interest 0 0 0 0 0  Down, Depressed, Hopeless 0 0 0 0 0  PHQ - 2 Score 0  0 0 0 0   Fall Risk  03/08/2017 01/29/2017 12/11/2016 08/08/2016  Falls in the past year? No Yes No No  Number falls in past yr: - 1 - -  Injury with Fall? - No - -        Assessment & Plan:   1. Degenerative disc disease, lumbar   2. Primary osteoarthritis of right knee   3. Morbid obesity ([place])    -minimal weight loss in the past month yet is adjusting diet significantly. -s/p bariatric surgery consultation; s/p attending bariatric surgery seminar.  S/p UGI and CXR; scheduled for nutrition consultation. -recommend weight loss, exercise for 30-60 minutes five days per week; recommend 1200 kcal restriction per day with a minimum of 60 grams of protein per day. -continues to suffer with ongoing knee pain; followed by ortho; desires to attempt aggressive weight loss. -continues to suffer with ongoing daily lower back pain; hopes that weight loss will decrease pain significantly.    No orders of the defined types were placed in this encounter.  No orders of the defined types were placed in this encounter.   No Follow-up on file.   Kristi Elayne Guerin, M.D. Primary Care at [facility] previously Urgent  [facility] [address] [place], [place]  [address] [phone] phone [phone] fax

## 2017-03-08 NOTE — Patient Instructions (Addendum)
PROTEIN SOURCES: EGGS BACON SAUSAGE/TURKEY SAUSAGE PROTEIN SHAKES (SLIM FAST 20 GRAMS) GREEK YOGURT  CHEESE STICKS LUNCH MEAT/DELI MEAT ANY MEAT -- SALMON/CHICKEN BAKED/STEAK/BURGER  GOAL OF 60 GRAMS OF PROTEIN PER DAY (20 GRAMS OF PROTEIN PER MEAL).     IF you received an x-ray today, you will receive an invoice from [facility]. Please contact [facility] at [phone] with questions or concerns regarding your invoice.   IF you received labwork today, you will receive an invoice from [facility]. Please contact [facility] at [phone] with questions or concerns regarding your invoice.   Our billing staff will not be able to assist you with questions regarding bills from these companies.  You will be contacted with the lab results as soon as they are available. The fastest way to get your results is to activate your My Chart account. Instructions are located on the last page of this paperwork. If you have not heard from [place] regarding the results in 2 weeks, please contact this office.

## 2017-03-14 ENCOUNTER — Encounter: Payer: Self-pay | Admitting: Skilled Nursing Facility1

## 2017-03-14 ENCOUNTER — Encounter: Payer: 59 | Attending: General Surgery | Admitting: Skilled Nursing Facility1

## 2017-03-14 DIAGNOSIS — Z6841 Body Mass Index (BMI) 40.0 and over, adult: Secondary | ICD-10-CM | POA: Insufficient documentation

## 2017-03-14 DIAGNOSIS — E669 Obesity, unspecified: Secondary | ICD-10-CM | POA: Insufficient documentation

## 2017-03-14 DIAGNOSIS — Z713 Dietary counseling and surveillance: Secondary | ICD-10-CM | POA: Insufficient documentation

## 2017-03-14 NOTE — Progress Notes (Signed)
Pre-Op Assessment Visit:  Pre-Operative Sleeve Gastrecomy Surgery  Medical Nutrition Therapy:  Appt start time: 2:00  End time:  3:00  Patient was seen on 03/14/2017 for Pre-Operative Nutrition Assessment. Assessment and letter of approval faxed to [facility] Surgery Bariatric Surgery Program coordinator on 03/14/2017.   Pt states she needs 2 SWL.   Pt expectation of surgery: To learn how to eat better  Pt expectation of Dietitian: To learn how to eat better  Start weight at [facility]: 264.6 BMI: 46.25  24 hr Dietary Recall: First Meal: skipped Snack:  Second Meal: leftovers: baked chicken, rice, green beans Snack:  Third Meal:  baked chicken, rice, green beans Snack:  Beverages: water 4-5 16.9 ounces, wine  Encouraged to engage in 150 minutes of moderate physical activity including cardiovascular and weight baring weekly  Handouts given during visit include:  . Pre-Op Goals . Bariatric Surgery Protein Shakes During the appointment today the following Pre-Op Goals were reviewed with the patient: . Maintain or lose weight as instructed by your surgeon . Make healthy food choices . Begin to limit portion sizes . Limited concentrated sugars and fried foods . Keep fat/sugar in the single digits per serving on             food labels . Practice CHEWING your food  (aim for 30 chews per bite or until applesauce consistency): eating at the table . Practice not drinking 15 minutes before, during, and 30 minutes after each meal/snack . Avoid all carbonated beverages  . Avoid Alcohol: just don't buy . Avoid/limit caffeinated beverages  . Avoid all sugar-sweetened beverages . Consume 3 meals per day; eat every 3-5 hours: drink a protein shake . Make a list of non-food related activities . Aim for 64-100 ounces of FLUID daily  . Aim for at least 60-80 grams of PROTEIN daily . Look for a liquid protein source that contain ?15 g protein and ?5 g carbohydrate  (ex: shakes, drinks,  shots): this evening   -Follow diet recommendations listed below   Energy and Macronutrient Recomendations: Calories: 1600 Carbohydrate: 180 Protein: 120 Fat: 44  Demonstrated degree of understanding via:  Teach Back  Teaching Method Utilized:  Visual Auditory Hands on  Barriers to learning/adherence to lifestyle change: none identified   Patient to call the [facility] to enroll in Pre-Op and Post-Op Nutrition Education when surgery date is scheduled.

## 2017-04-02 ENCOUNTER — Encounter (INDEPENDENT_AMBULATORY_CARE_PROVIDER_SITE_OTHER): Payer: 59

## 2017-04-03 ENCOUNTER — Encounter: Payer: Self-pay | Admitting: Skilled Nursing Facility1

## 2017-04-03 ENCOUNTER — Ambulatory Visit: Payer: 59 | Admitting: Family Medicine

## 2017-04-03 ENCOUNTER — Encounter: Payer: 59 | Admitting: Skilled Nursing Facility1

## 2017-04-03 DIAGNOSIS — Z713 Dietary counseling and surveillance: Secondary | ICD-10-CM | POA: Diagnosis not present

## 2017-04-03 NOTE — Progress Notes (Signed)
Sleeve Gastrectomy   Assessment:   1st SWL Appointment. Pt states she needs 2 SWL.  Pt arrives having lost about 5 pounds. Pt states she has been eating breakfast every day. Pt states she has been cooking more at home. Pt states she has been feeling less bloated and gassy from eating more at home. Pt states she will try a Protein shake this month. Pt states eating away from the television has helped her chew better.  Pt seems to mentally be in a good place before surgery.   Start weight at [facility]: 264.6 Wt: 259.12.8 BMI: 45.44  MEDICATIONS: See List   DIETARY INTAKE:  24-hr recall:  B ( AM): slim fast shake with banana  Snk ( AM): fruit L ( PM): meatloaf and green beans and brown rice Snk ( PM): D ( PM): meatloaf and green beans and brown rice Snk ( PM): Beverages: 16 oz of water 3-4 times a day,   Usual physical activity: ADL's  Diet to Follow: 1500 calories 170 g carbohydrates 112 g protein 42 g fat   Nutritional Diagnosis:  [place]-3.3 Overweight/obesity related to past poor dietary habits and physical inactivity as evidenced by patient w/ planned sleeve surgery following dietary guidelines for continued weight loss.    Intervention:  Nutrition counseling for upcoming Bariatric Surgery. Goals: -Encouraged to engage in 150 minutes of moderate physical activity including cardiovascular and weight baring weekly -get in the routine of being aware of how much you are drinking: get a water bottle to use  Teaching Method Utilized:  Visual Auditory Hands on   Barriers to learning/adherence to lifestyle change: none identified   Demonstrated degree of understanding via:  Teach Back   Monitoring/Evaluation:  Dietary intake, exercise,and body weight prn.

## 2017-04-03 NOTE — Patient Instructions (Addendum)
-  Get in the routine of being aware of how much you are drinking: get a water bottle to use  -Keep working on chewing very well  -Read the label on your fudge bars

## 2017-04-11 ENCOUNTER — Ambulatory Visit: Payer: Self-pay | Admitting: Skilled Nursing Facility1

## 2017-04-23 ENCOUNTER — Ambulatory Visit (INDEPENDENT_AMBULATORY_CARE_PROVIDER_SITE_OTHER): Payer: 59 | Admitting: Psychiatry

## 2017-04-23 DIAGNOSIS — F509 Eating disorder, unspecified: Secondary | ICD-10-CM | POA: Diagnosis not present

## 2017-04-24 ENCOUNTER — Encounter: Payer: 59 | Attending: General Surgery | Admitting: Skilled Nursing Facility1

## 2017-04-24 ENCOUNTER — Encounter: Payer: Self-pay | Admitting: Skilled Nursing Facility1

## 2017-04-24 DIAGNOSIS — E669 Obesity, unspecified: Secondary | ICD-10-CM | POA: Diagnosis not present

## 2017-04-24 DIAGNOSIS — Z713 Dietary counseling and surveillance: Secondary | ICD-10-CM | POA: Diagnosis present

## 2017-04-24 DIAGNOSIS — Z6841 Body Mass Index (BMI) 40.0 and over, adult: Secondary | ICD-10-CM | POA: Insufficient documentation

## 2017-04-24 NOTE — Patient Instructions (Addendum)
-  try splenda in your grits  -Switch your milk to 1%

## 2017-04-24 NOTE — Progress Notes (Signed)
Sleeve Gastrectomy   Assessment:  2nd SWL Appointment. Pt states she needs 2 SWL. Pt seems to mentally be in a good place before surgery.  Pt states she reduced her sugar intake. Pt states she usually has grits with sugar but has worked on that. Pt states she tried cooking the green beans by sauteing and really enjoyed them. Pt states she was walking but is now is having a hard menstral cycle. Pt states she is going to look into water aerobics. Pt states she has been using a water bottle and sipping on water all day long. Pt states her whole family is on board with all of the changes.   Start weight at [facility]: 264.6 Wt: 259.12.8 BMI: 45.44  MEDICATIONS: See List   DIETARY INTAKE:  24-hr recall:  B ( AM): slim fast shake with banana or grits with sugar Snk ( AM): fruit L ( PM): meatloaf and green beans and brown rice or salmon with potatoes and green beans Snk ( PM): yogurt or low sugar fudge pops D ( PM): meatloaf and green beans and brown rice or corn on the cob and peas and grilled chicken Snk ( PM): Beverages: 16 oz of water 3-4 times a day  Usual physical activity: walking around the track  Diet to Follow: 1500 calories 170 g carbohydrates 112 g protein 42 g fat   Nutritional Diagnosis:  [place]-3.3 Overweight/obesity related to past poor dietary habits and physical inactivity as evidenced by patient w/ planned sleeve surgery following dietary guidelines for continued weight loss.    Intervention:  Nutrition counseling for upcoming Bariatric Surgery. Goals: -Encouraged to engage in 150 minutes of moderate physical activity including cardiovascular and weight baring weekly -try splenda in your grits -Switch your milk to 1%  Teaching Method Utilized:  Visual Auditory Hands on   Barriers to learning/adherence to lifestyle change: none identified   Demonstrated degree of understanding via:  Teach Back   Monitoring/Evaluation:  Dietary intake, exercise,and body weight prn.

## 2017-05-09 ENCOUNTER — Ambulatory Visit: Payer: 59 | Admitting: Psychiatry

## 2017-05-16 ENCOUNTER — Ambulatory Visit: Payer: Self-pay | Admitting: Skilled Nursing Facility1

## 2017-05-20 ENCOUNTER — Ambulatory Visit: Payer: Self-pay

## 2017-06-18 ENCOUNTER — Ambulatory Visit: Payer: 59 | Admitting: Psychiatry

## 2017-06-24 ENCOUNTER — Ambulatory Visit: Payer: Self-pay

## 2017-07-03 ENCOUNTER — Ambulatory Visit (INDEPENDENT_AMBULATORY_CARE_PROVIDER_SITE_OTHER): Payer: 59 | Admitting: Psychiatry

## 2017-07-03 DIAGNOSIS — F509 Eating disorder, unspecified: Secondary | ICD-10-CM

## 2017-07-08 ENCOUNTER — Ambulatory Visit: Payer: Self-pay | Admitting: Skilled Nursing Facility1

## 2017-07-18 ENCOUNTER — Encounter: Payer: 59 | Attending: General Surgery | Admitting: Registered"

## 2017-07-18 ENCOUNTER — Encounter: Payer: Self-pay | Admitting: Registered"

## 2017-07-18 DIAGNOSIS — Z713 Dietary counseling and surveillance: Secondary | ICD-10-CM | POA: Diagnosis present

## 2017-07-18 DIAGNOSIS — Z6841 Body Mass Index (BMI) 40.0 and over, adult: Secondary | ICD-10-CM | POA: Insufficient documentation

## 2017-07-18 DIAGNOSIS — E669 Obesity, unspecified: Secondary | ICD-10-CM | POA: Diagnosis not present

## 2017-07-18 NOTE — Progress Notes (Signed)
Sleeve Gastrectomy   Assessment:  3rd SWL Appointment. Pt states she needs 2 SWL.  Pt arrives having gained about 7 pounds from previous visit. Pt states her surgery date is 1/22 and needs pre-op diet information today. Pt is scheduled for pre-op class on Mon, 1/17 and will get the remainder of the information. Pt states she has an understanding of what she needs to do from now until surgery related to pre-op diet and vitamin/mineral recommendations.   Pt was given Bariatric Advantage Multi Lot # [id number] Exp 01/2018  Pt seems to mentally be in a good place before surgery.  Pt states she reduced her sugar intake. Pt states she usually has grits with sugar but has worked on that. Pt states she tried cooking the green beans by sauteing and really enjoyed them. Pt states she was walking but is now is having a hard menstral cycle. Pt states she is going to look into water aerobics. Pt states she has been using a water bottle and sipping on water all day long. Pt states her whole family is on board with all of the changes.   Start weight at [facility]: 264.6 Wt: 266.9 BMI: 46.68  MEDICATIONS: See List   DIETARY INTAKE:  24-hr recall:  B ( AM): slim fast shake with banana or grits with sugar Snk ( AM): fruit L ( PM): meatloaf and green beans and brown rice or salmon with potatoes and green beans Snk ( PM): yogurt or low sugar fudge pops D ( PM): meatloaf and green beans and brown rice or corn on the cob and peas and grilled chicken Snk ( PM): Beverages: 16 oz of water 3-4 times a day  Usual physical activity: walking around the track  Diet to Follow: 1500 calories 170 g carbohydrates 112 g protein 42 g fat   Nutritional Diagnosis:  [place]-3.3 Overweight/obesity related to past poor dietary habits and physical inactivity as evidenced by patient w/ planned sleeve surgery following dietary guidelines for continued weight loss.    Intervention:  Nutrition counseling for upcoming Bariatric  Surgery. Goals: -Encouraged to engage in 150 minutes of moderate physical activity including cardiovascular and weight baring weekly  Teaching Method Utilized:  Visual Auditory Hands on   Barriers to learning/adherence to lifestyle change: none identified   Demonstrated degree of understanding via:  Teach Back   Monitoring/Evaluation:  Dietary intake, exercise,and body weight prn.

## 2017-07-22 ENCOUNTER — Encounter: Payer: 59 | Admitting: Skilled Nursing Facility1

## 2017-07-22 DIAGNOSIS — Z713 Dietary counseling and surveillance: Secondary | ICD-10-CM | POA: Diagnosis not present

## 2017-07-24 ENCOUNTER — Encounter: Payer: Self-pay | Admitting: Skilled Nursing Facility1

## 2017-07-24 NOTE — Progress Notes (Signed)
Pre-Operative Nutrition Class:  Appt start time: 3543   End time:  1830.  Patient was seen on 07/22/2017 for Pre-Operative Bariatric Surgery [facility].   Surgery date: 07/30/2017 Surgery type: sleeve gastrectomy  Start weight at [facility]: 264.6 Weight today: 268  Samples given per MNT protocol. Patient educated on appropriate usage: Bariatric Advantage Multivitamin Lot # [id number] Exp: 01/2018  Bariatric Advantage Calcium  Lot # [id number] Exp:03-16-18  [place] Protein Shake Lot # [id number] Exp: 04-20-18  The following the learning objectives were met by the patient during this course:  Identify Pre-Op Dietary Goals and will begin 2 weeks pre-operatively  Identify appropriate sources of fluids and proteins   State protein recommendations and appropriate sources pre and post-operatively  Identify Post-Operative Dietary Goals and will follow for 2 weeks post-operatively  Identify appropriate multivitamin and calcium sources  Describe the need for physical activity post-operatively and will follow MD recommendations  State when to call healthcare provider regarding medication questions or post-operative complications  Handouts given during class include:  Pre-Op Bariatric Surgery Diet Handout  Protein Shake Handout  Post-Op Bariatric Surgery Nutrition Handout  BELT Program Information Flyer  Support Group Information Flyer  [facility] Bariatric Supplements Price List  [facility]: [facility] per MD.

## 2017-07-25 ENCOUNTER — Ambulatory Visit: Payer: Self-pay | Admitting: General Surgery

## 2017-07-25 ENCOUNTER — Other Ambulatory Visit: Payer: Self-pay

## 2017-07-25 ENCOUNTER — Encounter: Payer: Self-pay | Admitting: Family Medicine

## 2017-07-25 ENCOUNTER — Ambulatory Visit: Payer: Self-pay | Admitting: *Deleted

## 2017-07-25 ENCOUNTER — Ambulatory Visit (INDEPENDENT_AMBULATORY_CARE_PROVIDER_SITE_OTHER): Payer: 59 | Admitting: Family Medicine

## 2017-07-25 VITALS — BP 116/74 | HR 93 | Temp 98.6°F | Resp 18 | Ht 63.4 in | Wt 258.6 lb

## 2017-07-25 DIAGNOSIS — A084 Viral intestinal infection, unspecified: Secondary | ICD-10-CM

## 2017-07-25 NOTE — Telephone Encounter (Signed)
Pt called with complaints of diarrhea which started ; she states that she ate salmon with cream sauce and spinach that were previously cooked and frozen; she also states that she has been drinking them since Monday 07/22/17; per nurse triage  Instructions given to pt; pt previously scheduled for appointment 07/25/17 with Dr Pamella Pert, [place] [phone] at 1640; pt verbalizes understanding.  Reason for Disposition . [1] SEVERE diarrhea (e.g., 7 or more times / day more than normal) AND [2] present > 24 hours (1 day)  Answer Assessment - Initial Assessment Questions 1. DIARRHEA SEVERITY: "How bad is the diarrhea?" "How many extra stools have you had in the past 24 hours than normal?"    - MILD: Few loose or mushy BMs; increase of 1-3 stools over normal daily number of stools; mild increase in ostomy output.   - MODERATE: Increase of 4-6 stools daily over normal; moderate increase in ostomy output.   - SEVERE (or Worst Possible): Increase of 7 or more stools daily over normal; moderate increase in ostomy output; incontinence.     Going to bathroom hourly 2. ONSET: "When did the diarrhea begin?"      Started 07/24/17 got better this morning but worse after drinking protein shake 3. BM CONSISTENCY: "How loose or watery is the diarrhea?"      watery 4. VOMITING: "Are you also vomiting?" If so, ask: "How many times in the past 24 hours?"      no 5. ABDOMINAL PAIN: "Are you having any abdominal pain?" If yes: "What does it feel like?" (e.g., crampy, dull, intermittent, constant)      Comes and goes; sometimes it gets worse when she has to use the bathroom 6. ABDOMINAL PAIN SEVERITY: If present, ask: "How bad is the pain?"  (e.g., Scale 1-10; mild, moderate, or severe)    - MILD (1-3): doesn't interfere with normal activities, abdomen soft and not tender to touch     - MODERATE (4-7): interferes with normal activities or awakens from sleep, tender to touch     - SEVERE (8-10): excruciating pain, doubled over, unable  to do any normal activities       Sometimes severe 7. ORAL INTAKE: If vomiting, "Have you been able to drink liquids?" "How much fluids have you had in the past 24 hours?"     No vomiting 8. HYDRATION: "Any signs of dehydration?" (e.g., dry mouth [not just dry lips], too weak to stand, dizziness, new weight loss) "When did you last urinate?"     No; pt states that she has not urinated since this morning between 11 am and 12 noon 9. EXPOSURE: "Have you traveled to a foreign country recently?" "Have you been exposed to anyone with diarrhea?" "Could you have eaten any food that was spoiled?"     Could have eaten something 10. OTHER SYMPTOMS: "Do you have any other symptoms?" (e.g., fever, blood in stool)       no 11. PREGNANCY: "Is there any chance you are pregnant?" "When was your last menstrual period?"       No; currently on menstrual cycle  Protocols used: DIARRHEA-A-AH

## 2017-07-25 NOTE — Progress Notes (Signed)
1/17/20194:56 PM  Sandra Jenkins May 12, 1971, 47 y.o. female [id number]  Chief Complaint  Patient presents with  . Diarrhea    x2 days, pt started drinking protein shakes on 07/22/17 in preparation for bariatric surgery; she also states that she ate some salmon with cream sauce and spinach that was previously prepared and then frozen. Pt states she hasn't tried anything OTC.    HPI:   Patient is a 47 y.o. female with past medical history significant for upcoming bariatric surgery who presents today for 2 days of abd cramping, diarrhea and nausea. No vomiting, no fever or chills. No blood in stool. She states that it started after she had left over salmon from [employer]. She has been able to maintain hydration, she is urinating ok. She is supposed to have bariatric surgery in 5 days.   Depression screen [facility] 2/9 07/25/2017 07/18/2017 03/08/2017  Decreased Interest 0 0 0  Down, Depressed, Hopeless 0 0 0  PHQ - 2 Score 0 0 0    No Known Allergies  Prior to Admission medications   Medication Sig Start Date End Date Taking? Authorizing Provider  Calcium Carbonate-Vitamin D (CALCIUM 500 + D PO) Take 1 tablet by mouth 2 (two) times daily.   Yes [provider]  ibuprofen (ADVIL,MOTRIN) 200 MG tablet Take 400 mg by mouth 2 (two) times daily as needed for moderate pain.   Yes [provider]  Multiple Vitamin (MULTIVITAMIN WITH MINERALS) TABS tablet Take 1 tablet by mouth daily.   Yes [provider]    Past Medical History:  Diagnosis Date  . Anemia    Sickle Cell Trait  . Arthritis    BILATERAL KNEES; DDD lumbar spine    Past Surgical History:  Procedure Laterality Date  . BREAST SURGERY     breast reduction  . CESAREAN SECTION    . COSMETIC SURGERY    . REDUCTION MAMMAPLASTY Bilateral 2004  . TUBAL LIGATION      Social History   Tobacco Use  . Smoking status: Never Smoker  . Smokeless tobacco: Never Used  Substance Use Topics  .  Alcohol use: Yes    Alcohol/week: 1.2 - 1.8 oz    Types: 2 - 3 Standard drinks or equivalent per week    Comment: 2/month    Family History  Problem Relation Age of Onset  . Heart disease Maternal Grandmother   . Heart attack Maternal Grandmother   . Heart disease Maternal Grandfather   . Heart attack Maternal Grandfather   . Heart disease Paternal Grandmother   . Heart attack Paternal Grandmother   . Arthritis Father     ROS Per hpi  OBJECTIVE:  Blood pressure 116/74, pulse 93, temperature 98.6 F (37 C), temperature source Oral, resp. rate 18, height 5' 3.4" (1.61 m), weight 258 lb 9.6 oz (117.3 kg), last menstrual period 07/19/2017, SpO2 98 %.  Physical Exam  Constitutional: She is oriented to person, place, and time and well-developed, well-nourished, and in no distress.  HENT:  Head: Normocephalic and atraumatic.  Mouth/Throat: Oropharynx is clear and moist. No oropharyngeal exudate.  Eyes: EOM are normal. Pupils are equal, round, and reactive to light. No scleral icterus.  Neck: Neck supple.  Cardiovascular: Normal rate, regular rhythm and normal heart sounds. Exam reveals no gallop and no friction rub.  No murmur heard. Pulmonary/Chest: Effort normal and breath sounds normal. She has no wheezes. She has no rales.  Abdominal: Soft. Bowel sounds are hyperactive. There  is no hepatosplenomegaly. There is generalized tenderness. There is guarding. There is no rebound.  Musculoskeletal: She exhibits no edema.  Neurological: She is alert and oriented to person, place, and time. Gait normal.  Skin: Skin is warm and dry.     ASSESSMENT and PLAN  1. Viral gastroenteritis Discussed supportive measures, brat diet, importance of hydration, RTC precautions. I advised she notify bariatric surgeon of current illness.   Return if symptoms worsen or fail to improve.    Rutherford Guys, MD [facility] at [place], [place] [address] Ph.  [phone] Fax  [phone]

## 2017-07-25 NOTE — Patient Instructions (Addendum)
   IF you received an x-ray today, you will receive an invoice from [facility]. Please contact [facility] at [phone] with questions or concerns regarding your invoice.   IF you received labwork today, you will receive an invoice from [facility]. Please contact [facility] at [phone] with questions or concerns regarding your invoice.   Our billing staff will not be able to assist you with questions regarding bills from these companies.  You will be contacted with the lab results as soon as they are available. The fastest way to get your results is to activate your My Chart account. Instructions are located on the last page of this paperwork. If you have not heard from us regarding the results in 2 weeks, please contact this office.      Viral Gastroenteritis, Adult Viral gastroenteritis is also known as the stomach flu. This condition is caused by certain germs (viruses). These germs can be passed from person to person very easily (are very contagious). This condition can cause sudden watery poop (diarrhea), fever, and throwing up (vomiting). Having watery poop and throwing up can make you feel weak and cause you to get dehydrated. Dehydration can make you tired and thirsty, make you have a dry mouth, and make it so you pee (urinate) less often. Older adults and people with other diseases or a weak defense system (immune system) are at higher risk for dehydration. It is important to replace the fluids that you lose from having watery poop and throwing up. Follow these instructions at home: Follow instructions from your doctor about how to care for yourself at home. Eating and drinking  Follow these instructions as told by your doctor:  Take an oral rehydration solution (ORS). This is a drink that is sold at pharmacies and stores.  Drink clear fluids in small amounts as you are able, such as: ? Water. ? Ice chips. ? Diluted fruit juice. ? Low-calorie sports  drinks.  Eat bland, easy-to-digest foods in small amounts as you are able, such as: ? Bananas. ? Applesauce. ? Rice. ? Low-fat (lean) meats. ? Toast. ? Crackers.  Avoid fluids that have a lot of sugar or caffeine in them.  Avoid alcohol.  Avoid spicy or fatty foods.  General instructions  Drink enough fluid to keep your pee (urine) clear or pale yellow.  Wash your hands often. If you cannot use soap and water, use hand sanitizer.  Make sure that all people in your home wash their hands well and often.  Rest at home while you get better.  Take over-the-counter and prescription medicines only as told by your doctor.  Watch your condition for any changes.  Take a warm bath to help with any burning or pain from having watery poop.  Keep all follow-up visits as told by your doctor. This is important. Contact a doctor if:  You cannot keep fluids down.  Your symptoms get worse.  You have new symptoms.  You feel light-headed or dizzy.  You have muscle cramps. Get help right away if:  You have chest pain.  You feel very weak or you pass out (faint).  You see blood in your throw-up.  Your throw-up looks like coffee grounds.  You have bloody or black poop (stools) or poop that look like tar.  You have a very bad headache, a stiff neck, or both.  You have a rash.  You have very bad pain, cramping, or bloating in your belly (abdomen).  You have trouble breathing.    You are breathing very quickly.  Your heart is beating very quickly.  Your skin feels cold and clammy.  You feel confused.  You have pain when you pee.  You have signs of dehydration, such as: ? Dark pee, hardly any pee, or no pee. ? Cracked lips. ? Dry mouth. ? Sunken eyes. ? Sleepiness. ? Weakness. This information is not intended to replace advice given to you by your health care provider. Make sure you discuss any questions you have with your health care provider. Document Released:  12/12/2007 Document Revised: 01/13/2016 Document Reviewed: 03/01/2015 Elsevier Interactive Patient Education  2017 [employer]  

## 2017-07-26 ENCOUNTER — Encounter (HOSPITAL_COMMUNITY): Payer: Self-pay

## 2017-07-26 ENCOUNTER — Encounter: Payer: Self-pay | Admitting: Family Medicine

## 2017-07-26 NOTE — Patient Instructions (Addendum)
Your procedure is scheduled on: Tuesday, Jan. 22 ,2019   Surgery Time:  11:00AM-1:00PM   Report to [place]  Entrance    Report to admitting at 9:00 AM   Call this number if you have problems the morning of surgery [phone]   Do not eat food or drink liquids :After Midnight.   Do NOT smoke after Midnight   Complete one Ensure drink the morning of surgery 3 hours prior to scheduled surgery (by 8AM)   Take these medicines the morning of surgery with A SIP OF WATER:  None                               You may not have any metal on your body including hair pins, jewelry, and body piercings             Do not wear make-up, lotions, powders, perfumes/cologne, or deodorant             Do not wear nail polish.  Do not shave  48 hours prior to surgery.               Do not bring valuables to the hospital. [place].   Contacts, dentures or bridgework may not be worn into surgery.   Leave suitcase in the car. After surgery it may be brought to your room.   Special Instructions: Bring a copy of your healthcare power of attorney and living will documents         the day of surgery if you haven't scanned them in before.              Please read over the following fact sheets you were given:   [facility] - Preparing for Surgery Before surgery, you can play an important role.  Because skin is not sterile, your skin needs to be as free of germs as possible.  You can reduce the number of germs on your skin by washing with CHG (chlorahexidine gluconate) soap before surgery.  CHG is an antiseptic cleaner which kills germs and bonds with the skin to continue killing germs even after washing. Please DO NOT use if you have an allergy to CHG or antibacterial soaps.  If your skin becomes reddened/irritated stop using the CHG and inform your nurse when you arrive at Short Stay. Do not shave (including legs and underarms) for at  least 48 hours prior to the first CHG shower.  You may shave your face/neck.  Please follow these instructions carefully:  1.  Shower with CHG Soap the night before surgery and the  morning of surgery.  2.  If you choose to wash your hair, wash your hair first as usual with your normal  shampoo.  3.  After you shampoo, rinse your hair and body thoroughly to remove the shampoo.                             4.  Use CHG as you would any other liquid soap.  You can apply chg directly to the skin and wash.  Gently with a scrungie or clean washcloth.  5.  Apply the CHG Soap to your body ONLY FROM THE NECK DOWN.   Do   not use on face/  open                           Wound or open sores. Avoid contact with eyes, ears mouth and   genitals (private parts).                       Wash face,  Genitals (private parts) with your normal soap.             6.  Wash thoroughly, paying special attention to the area where your    surgery  will be performed.  7.  Thoroughly rinse your body with warm water from the neck down.  8.  DO NOT shower/wash with your normal soap after using and rinsing off the CHG Soap.                9.  Pat yourself dry with a clean towel.            10.  Wear clean pajamas.            11.  Place clean sheets on your bed the night of your first shower and do not  sleep with pets. Day of Surgery : Do not apply any lotions/deodorants the morning of surgery.  Please wear clean clothes to the hospital/surgery center.  FAILURE TO FOLLOW THESE INSTRUCTIONS MAY RESULT IN THE CANCELLATION OF YOUR SURGERY  PATIENT SIGNATURE_________________________________  NURSE SIGNATURE__________________________________  ________________________________________________________________________  WHAT IS A BLOOD TRANSFUSION? Blood Transfusion Information  A transfusion is the replacement of blood or some of its parts. Blood is made up of multiple cells which provide different functions.  Red blood cells  carry oxygen and are used for blood loss replacement.  White blood cells fight against infection.  Platelets control bleeding.  Plasma helps clot blood.  Other blood products are available for specialized needs, such as hemophilia or other clotting disorders. BEFORE THE TRANSFUSION  Who gives blood for transfusions?   Healthy volunteers who are fully evaluated to make sure their blood is safe. This is blood bank blood. Transfusion therapy is the safest it has ever been in the practice of medicine. Before blood is taken from a donor, a complete history is taken to make sure that person has no history of diseases nor engages in risky social behavior (examples are intravenous drug use or sexual activity with multiple partners). The donor's travel history is screened to minimize risk of transmitting infections, such as malaria. The donated blood is tested for signs of infectious diseases, such as HIV and hepatitis. The blood is then tested to be sure it is compatible with you in order to minimize the chance of a transfusion reaction. If you or a relative donates blood, this is often done in anticipation of surgery and is not appropriate for emergency situations. It takes many days to process the donated blood. RISKS AND COMPLICATIONS Although transfusion therapy is very safe and saves many lives, the main dangers of transfusion include:   Getting an infectious disease.  Developing a transfusion reaction. This is an allergic reaction to something in the blood you were given. Every precaution is taken to prevent this. The decision to have a blood transfusion has been considered carefully by your caregiver before blood is given. Blood is not given unless the benefits outweigh the risks. AFTER THE TRANSFUSION  Right after receiving a blood transfusion, you will usually feel much better and more energetic. This is especially  true if your red blood cells have gotten low (anemic). The transfusion raises  the level of the red blood cells which carry oxygen, and this usually causes an energy increase.  The nurse administering the transfusion will monitor you carefully for complications. HOME CARE INSTRUCTIONS  No special instructions are needed after a transfusion. You may find your energy is better. Speak with your caregiver about any limitations on activity for underlying diseases you may have. SEEK MEDICAL CARE IF:   Your condition is not improving after your transfusion.  You develop redness or irritation at the intravenous (IV) site. SEEK IMMEDIATE MEDICAL CARE IF:  Any of the following symptoms occur over the next 12 hours:  Shaking chills.  You have a temperature by mouth above 102 F (38.9 C), not controlled by medicine.  Chest, back, or muscle pain.  People around you feel you are not acting correctly or are confused.  Shortness of breath or difficulty breathing.  Dizziness and fainting.  You get a rash or develop hives.  You have a decrease in urine output.  Your urine turns a dark color or changes to pink, red, or brown. Any of the following symptoms occur over the next 10 days:  You have a temperature by mouth above 102 F (38.9 C), not controlled by medicine.  Shortness of breath.  Weakness after normal activity.  The white part of the eye turns yellow (jaundice).  You have a decrease in the amount of urine or are urinating less often.  Your urine turns a dark color or changes to pink, red, or brown. Document Released: 06/22/2000 Document Revised: 09/17/2011 Document Reviewed: 02/09/2008 [facility] Patient Information 2014 [place], [place].  _______________________________________________________________________

## 2017-07-26 NOTE — Pre-Procedure Instructions (Signed)
The following are in epic: Last office visit note Dr. Pamella Pert 07/25/17 EKG, CXR, KUB 03/08/17

## 2017-07-29 ENCOUNTER — Other Ambulatory Visit: Payer: Self-pay

## 2017-07-29 ENCOUNTER — Ambulatory Visit: Payer: Self-pay

## 2017-07-29 ENCOUNTER — Encounter (HOSPITAL_COMMUNITY)
Admission: RE | Admit: 2017-07-29 | Discharge: 2017-07-29 | Disposition: A | Discharge status: Home / Self Care | Payer: 59 | Source: Ambulatory Visit | Attending: General Surgery | Admitting: General Surgery

## 2017-07-29 ENCOUNTER — Encounter (HOSPITAL_COMMUNITY): Payer: Self-pay

## 2017-07-29 DIAGNOSIS — Z01818 Encounter for other preprocedural examination: Secondary | ICD-10-CM | POA: Diagnosis present

## 2017-07-29 DIAGNOSIS — E669 Obesity, unspecified: Secondary | ICD-10-CM | POA: Diagnosis not present

## 2017-07-29 HISTORY — DX: Gastro-esophageal reflux disease without esophagitis: K21.9

## 2017-07-29 HISTORY — DX: Prediabetes: R73.03

## 2017-07-29 HISTORY — DX: Other intervertebral disc degeneration, lumbar region: M51.36

## 2017-07-29 HISTORY — DX: Other intervertebral disc degeneration, lumbar region without mention of lumbar back pain or lower extremity pain: M51.369

## 2017-07-29 LAB — CBC WITH DIFFERENTIAL/PLATELET
BASOS ABS: 0 10*3/uL (ref 0.0–0.1)
Basophils Relative: 0 %
EOS ABS: 0.1 10*3/uL (ref 0.0–0.7)
Eosinophils Relative: 1 %
HCT: 35.1 % — ABNORMAL LOW (ref 36.0–46.0)
HEMOGLOBIN: 11.6 g/dL — AB (ref 12.0–15.0)
Lymphocytes Relative: 47 %
Lymphs Abs: 3.2 10*3/uL (ref 0.7–4.0)
MCH: 26.1 pg (ref 26.0–34.0)
MCHC: 33 g/dL (ref 30.0–36.0)
MCV: 79.1 fL (ref 78.0–100.0)
MONO ABS: 0.3 10*3/uL (ref 0.1–1.0)
Monocytes Relative: 5 %
NEUTROS PCT: 47 %
Neutro Abs: 3.1 10*3/uL (ref 1.7–7.7)
Platelets: 290 10*3/uL (ref 150–400)
RBC: 4.44 MIL/uL (ref 3.87–5.11)
RDW: 14.5 % (ref 11.5–15.5)
WBC: 6.7 10*3/uL (ref 4.0–10.5)

## 2017-07-29 LAB — COMPREHENSIVE METABOLIC PANEL
ALT: 14 U/L (ref 14–54)
ANION GAP: 5 (ref 5–15)
AST: 17 U/L (ref 15–41)
Albumin: 3.8 g/dL (ref 3.5–5.0)
Alkaline Phosphatase: 70 U/L (ref 38–126)
BUN: 15 mg/dL (ref 6–20)
CO2: 27 mmol/L (ref 22–32)
Calcium: 8.9 mg/dL (ref 8.9–10.3)
Chloride: 107 mmol/L (ref 101–111)
Creatinine, Ser: 0.76 mg/dL (ref 0.44–1.00)
GFR calc Af Amer: >60 mL/min (ref >60)
GFR calc non Af Amer: >60 mL/min (ref >60)
Glucose, Bld: 79 mg/dL (ref 65–99)
POTASSIUM: 3.7 mmol/L (ref 3.5–5.1)
SODIUM: 139 mmol/L (ref 135–145)
Total Bilirubin: 0.7 mg/dL (ref 0.3–1.2)
Total Protein: 7.1 g/dL (ref 6.5–8.1)

## 2017-07-29 LAB — ABO/RH: ABO/RH(D): O POS

## 2017-07-29 LAB — HEMOGLOBIN A1C
HEMOGLOBIN A1C: 5.6 % (ref 4.8–5.6)
MEAN PLASMA GLUCOSE: 114.02 mg/dL

## 2017-07-29 NOTE — Pre-Procedure Instructions (Signed)
CMP results 07/29/17 faxed to Dr. Kieth Brightly via epic.

## 2017-07-29 NOTE — Pre-Procedure Instructions (Signed)
MRSA was orginally place in epic 2012, negative MRSA 2015 in epic.

## 2017-07-30 ENCOUNTER — Encounter (HOSPITAL_COMMUNITY)
Admission: RE | Disposition: A | Discharge status: Home / Self Care | Payer: Self-pay | Source: Ambulatory Visit | Attending: General Surgery

## 2017-07-30 ENCOUNTER — Encounter (HOSPITAL_COMMUNITY): Payer: Self-pay

## 2017-07-30 ENCOUNTER — Inpatient Hospital Stay (HOSPITAL_COMMUNITY): Payer: 59 | Admitting: Anesthesiology

## 2017-07-30 ENCOUNTER — Inpatient Hospital Stay (HOSPITAL_COMMUNITY)
Admission: RE | Admit: 2017-07-30 | Discharge: 2017-08-01 | DRG: 621 | Disposition: A | Discharge status: Home / Self Care | Payer: 59 | Source: Ambulatory Visit | Attending: General Surgery | Admitting: General Surgery

## 2017-07-30 DIAGNOSIS — Z79899 Other long term (current) drug therapy: Secondary | ICD-10-CM | POA: Diagnosis not present

## 2017-07-30 DIAGNOSIS — Z6841 Body Mass Index (BMI) 40.0 and over, adult: Secondary | ICD-10-CM | POA: Diagnosis not present

## 2017-07-30 DIAGNOSIS — M170 Bilateral primary osteoarthritis of knee: Secondary | ICD-10-CM | POA: Diagnosis present

## 2017-07-30 DIAGNOSIS — M5136 Other intervertebral disc degeneration, lumbar region: Secondary | ICD-10-CM | POA: Diagnosis present

## 2017-07-30 DIAGNOSIS — K219 Gastro-esophageal reflux disease without esophagitis: Secondary | ICD-10-CM | POA: Diagnosis present

## 2017-07-30 DIAGNOSIS — R7303 Prediabetes: Secondary | ICD-10-CM | POA: Diagnosis present

## 2017-07-30 DIAGNOSIS — D573 Sickle-cell trait: Secondary | ICD-10-CM | POA: Diagnosis present

## 2017-07-30 HISTORY — PX: LAPAROSCOPIC GASTRIC SLEEVE RESECTION: SHX5895

## 2017-07-30 LAB — HEMOGLOBIN AND HEMATOCRIT, BLOOD
HEMATOCRIT: 33.3 % — AB (ref 36.0–46.0)
Hemoglobin: 11 g/dL — ABNORMAL LOW (ref 12.0–15.0)

## 2017-07-30 LAB — TYPE AND SCREEN
ABO/RH(D): O POS
Antibody Screen: NEGATIVE

## 2017-07-30 LAB — PREGNANCY, URINE: PREG TEST UR: NEGATIVE

## 2017-07-30 SURGERY — GASTRECTOMY, SLEEVE, LAPAROSCOPIC
Anesthesia: General | Site: Abdomen

## 2017-07-30 MED ORDER — SIMETHICONE 80 MG PO CHEW: 80.0000 mg | CHEWABLE_TABLET | Freq: Four times a day (QID) | ORAL | Status: DC | PRN

## 2017-07-30 MED ORDER — SUGAMMADEX SODIUM 500 MG/5ML IV SOLN
INTRAVENOUS | Status: DC | PRN
  Administered 2017-07-30: 300 mg via INTRAVENOUS

## 2017-07-30 MED ORDER — EPHEDRINE SULFATE-NACL 50-0.9 MG/10ML-% IV SOSY
PREFILLED_SYRINGE | INTRAVENOUS | Status: DC | PRN
  Administered 2017-07-30 (×2): 10 mg via INTRAVENOUS
  Administered 2017-07-30: 5 mg via INTRAVENOUS

## 2017-07-30 MED ORDER — CEFOTETAN DISODIUM-DEXTROSE 2-2.08 GM-%(50ML) IV SOLR
2.0000 g | INTRAVENOUS | Status: AC
  Administered 2017-07-30: 2 g via INTRAVENOUS
  Filled 2017-07-30: qty 50

## 2017-07-30 MED ORDER — ONDANSETRON HCL 4 MG/2ML IJ SOLN
INTRAMUSCULAR | Status: AC
  Filled 2017-07-30: qty 2

## 2017-07-30 MED ORDER — MORPHINE SULFATE (PF) 4 MG/ML IV SOLN
1.0000 mg | INTRAVENOUS | Status: DC | PRN
  Administered 2017-07-30: 3 mg via INTRAVENOUS

## 2017-07-30 MED ORDER — ROCURONIUM BROMIDE 10 MG/ML (PF) SYRINGE
PREFILLED_SYRINGE | INTRAVENOUS | Status: DC | PRN
  Administered 2017-07-30: 10 mg via INTRAVENOUS
  Administered 2017-07-30: 50 mg via INTRAVENOUS

## 2017-07-30 MED ORDER — ACETAMINOPHEN 500 MG PO TABS
1000.0000 mg | ORAL_TABLET | ORAL | Status: AC
  Administered 2017-07-30: 1000 mg via ORAL
  Filled 2017-07-30: qty 2

## 2017-07-30 MED ORDER — ACETAMINOPHEN 160 MG/5ML PO SOLN
650.0000 mg | Freq: Four times a day (QID) | ORAL | Status: DC
  Administered 2017-07-30 – 2017-08-01 (×5): 650 mg via ORAL
  Filled 2017-07-30 (×6): qty 20.3

## 2017-07-30 MED ORDER — LACTATED RINGERS IV SOLN
INTRAVENOUS | Status: DC
  Administered 2017-07-30 (×2): via INTRAVENOUS

## 2017-07-30 MED ORDER — BUPIVACAINE LIPOSOME 1.3 % IJ SUSP
20.0000 mL | Freq: Once | INTRAMUSCULAR | Status: AC
  Administered 2017-07-30: 20 mL
  Filled 2017-07-30: qty 20

## 2017-07-30 MED ORDER — FENTANYL CITRATE (PF) 100 MCG/2ML IJ SOLN
25.0000 ug | INTRAMUSCULAR | Status: DC | PRN
  Administered 2017-07-30 (×2): 50 ug via INTRAVENOUS

## 2017-07-30 MED ORDER — PHENYLEPHRINE 40 MCG/ML (10ML) SYRINGE FOR IV PUSH (FOR BLOOD PRESSURE SUPPORT)
PREFILLED_SYRINGE | INTRAVENOUS | Status: DC | PRN
  Administered 2017-07-30 (×3): 80 ug via INTRAVENOUS

## 2017-07-30 MED ORDER — GABAPENTIN 300 MG PO CAPS
300.0000 mg | ORAL_CAPSULE | ORAL | Status: AC
  Administered 2017-07-30: 300 mg via ORAL
  Filled 2017-07-30: qty 1

## 2017-07-30 MED ORDER — HYDRALAZINE HCL 20 MG/ML IJ SOLN: 10.0000 mg | INTRAMUSCULAR | Status: DC | PRN

## 2017-07-30 MED ORDER — FENTANYL CITRATE (PF) 100 MCG/2ML IJ SOLN
INTRAMUSCULAR | Status: AC
  Filled 2017-07-30: qty 2

## 2017-07-30 MED ORDER — SUCCINYLCHOLINE CHLORIDE 200 MG/10ML IV SOSY
PREFILLED_SYRINGE | INTRAVENOUS | Status: DC | PRN
  Administered 2017-07-30: 120 mg via INTRAVENOUS

## 2017-07-30 MED ORDER — BUPIVACAINE HCL (PF) 0.25 % IJ SOLN
INTRAMUSCULAR | Status: AC
  Filled 2017-07-30: qty 30

## 2017-07-30 MED ORDER — MIDAZOLAM HCL 2 MG/2ML IJ SOLN
INTRAMUSCULAR | Status: AC
  Filled 2017-07-30: qty 2

## 2017-07-30 MED ORDER — OXYCODONE HCL 5 MG PO TABS: 5.0000 mg | ORAL_TABLET | Freq: Once | ORAL | Status: DC | PRN

## 2017-07-30 MED ORDER — MIDAZOLAM HCL 5 MG/5ML IJ SOLN
INTRAMUSCULAR | Status: DC | PRN
  Administered 2017-07-30: 2 mg via INTRAVENOUS

## 2017-07-30 MED ORDER — GABAPENTIN 250 MG/5ML PO SOLN
200.0000 mg | Freq: Two times a day (BID) | ORAL | Status: DC
  Administered 2017-07-30 – 2017-08-01 (×4): 200 mg via ORAL
  Filled 2017-07-30 (×4): qty 4

## 2017-07-30 MED ORDER — LIDOCAINE HCL 2 % IJ SOLN
INTRAMUSCULAR | Status: AC
  Filled 2017-07-30: qty 20

## 2017-07-30 MED ORDER — TRAMADOL HCL 50 MG PO TABS
50.0000 mg | ORAL_TABLET | Freq: Four times a day (QID) | ORAL | Status: DC | PRN
  Administered 2017-07-30 – 2017-08-01 (×4): 50 mg via ORAL
  Filled 2017-07-30 (×4): qty 1

## 2017-07-30 MED ORDER — MORPHINE SULFATE (PF) 4 MG/ML IV SOLN
INTRAVENOUS | Status: AC
  Filled 2017-07-30: qty 1

## 2017-07-30 MED ORDER — ONDANSETRON HCL 4 MG/2ML IJ SOLN
INTRAMUSCULAR | Status: DC | PRN
  Administered 2017-07-30: 4 mg via INTRAVENOUS

## 2017-07-30 MED ORDER — PROMETHAZINE HCL 25 MG/ML IJ SOLN: 6.2500 mg | INTRAMUSCULAR | Status: DC | PRN

## 2017-07-30 MED ORDER — DEXAMETHASONE SODIUM PHOSPHATE 10 MG/ML IJ SOLN
INTRAMUSCULAR | Status: DC | PRN
  Administered 2017-07-30: 10 mg via INTRAVENOUS

## 2017-07-30 MED ORDER — CHLORHEXIDINE GLUCONATE 4 % EX LIQD: 60.0000 mL | Freq: Once | CUTANEOUS | Status: DC

## 2017-07-30 MED ORDER — APREPITANT 40 MG PO CAPS
40.0000 mg | ORAL_CAPSULE | ORAL | Status: AC
  Administered 2017-07-30: 40 mg via ORAL
  Filled 2017-07-30: qty 1

## 2017-07-30 MED ORDER — PROPOFOL 10 MG/ML IV BOLUS
INTRAVENOUS | Status: AC
  Filled 2017-07-30: qty 20

## 2017-07-30 MED ORDER — PROMETHAZINE HCL 25 MG/ML IJ SOLN
INTRAMUSCULAR | Status: AC
  Filled 2017-07-30: qty 1

## 2017-07-30 MED ORDER — EPHEDRINE 5 MG/ML INJ
INTRAVENOUS | Status: AC
  Filled 2017-07-30: qty 10

## 2017-07-30 MED ORDER — KETAMINE HCL 10 MG/ML IJ SOLN
INTRAMUSCULAR | Status: DC | PRN
  Administered 2017-07-30: 30 mg via INTRAVENOUS

## 2017-07-30 MED ORDER — SUCCINYLCHOLINE CHLORIDE 200 MG/10ML IV SOSY
PREFILLED_SYRINGE | INTRAVENOUS | Status: AC
  Filled 2017-07-30: qty 10

## 2017-07-30 MED ORDER — PROPOFOL 10 MG/ML IV BOLUS
INTRAVENOUS | Status: DC | PRN
  Administered 2017-07-30: 200 mg via INTRAVENOUS

## 2017-07-30 MED ORDER — OXYCODONE HCL 5 MG/5ML PO SOLN
5.0000 mg | ORAL | Status: DC | PRN
  Administered 2017-07-31 (×2): 10 mg via ORAL
  Administered 2017-07-31: 5 mg via ORAL
  Filled 2017-07-30 (×2): qty 10
  Filled 2017-07-30: qty 5

## 2017-07-30 MED ORDER — BUPIVACAINE HCL 0.25 % IJ SOLN
INTRAMUSCULAR | Status: DC | PRN
  Administered 2017-07-30: 30 mL

## 2017-07-30 MED ORDER — LIDOCAINE 2% (20 MG/ML) 5 ML SYRINGE
INTRAMUSCULAR | Status: DC | PRN
  Administered 2017-07-30: 1.5 mg/kg/h via INTRAVENOUS

## 2017-07-30 MED ORDER — HEPARIN SODIUM (PORCINE) 5000 UNIT/ML IJ SOLN
5000.0000 [IU] | INTRAMUSCULAR | Status: AC
  Administered 2017-07-30: 5000 [IU] via SUBCUTANEOUS
  Filled 2017-07-30: qty 1

## 2017-07-30 MED ORDER — LIDOCAINE 2% (20 MG/ML) 5 ML SYRINGE
INTRAMUSCULAR | Status: AC
  Filled 2017-07-30: qty 5

## 2017-07-30 MED ORDER — PHENYLEPHRINE 40 MCG/ML (10ML) SYRINGE FOR IV PUSH (FOR BLOOD PRESSURE SUPPORT)
PREFILLED_SYRINGE | INTRAVENOUS | Status: AC
  Filled 2017-07-30: qty 10

## 2017-07-30 MED ORDER — FENTANYL CITRATE (PF) 100 MCG/2ML IJ SOLN
INTRAMUSCULAR | Status: DC | PRN
  Administered 2017-07-30 (×2): 50 ug via INTRAVENOUS

## 2017-07-30 MED ORDER — ROCURONIUM BROMIDE 50 MG/5ML IV SOSY
PREFILLED_SYRINGE | INTRAVENOUS | Status: AC
  Filled 2017-07-30: qty 5

## 2017-07-30 MED ORDER — SUGAMMADEX SODIUM 500 MG/5ML IV SOLN
INTRAVENOUS | Status: AC
  Filled 2017-07-30: qty 5

## 2017-07-30 MED ORDER — SODIUM CHLORIDE 0.9 % IV SOLN
INTRAVENOUS | Status: DC
  Administered 2017-07-30 – 2017-08-01 (×6): via INTRAVENOUS

## 2017-07-30 MED ORDER — KETAMINE HCL 10 MG/ML IJ SOLN
INTRAMUSCULAR | Status: AC
  Filled 2017-07-30: qty 1

## 2017-07-30 MED ORDER — PREMIER PROTEIN SHAKE
2.0000 [oz_av] | ORAL | Status: DC
  Administered 2017-07-31 – 2017-08-01 (×8): 2 [oz_av] via ORAL

## 2017-07-30 MED ORDER — DEXAMETHASONE SODIUM PHOSPHATE 10 MG/ML IJ SOLN
INTRAMUSCULAR | Status: AC
  Filled 2017-07-30: qty 1

## 2017-07-30 MED ORDER — LACTATED RINGERS IR SOLN
Status: DC | PRN
  Administered 2017-07-30: 1000 mL

## 2017-07-30 MED ORDER — OXYCODONE HCL 5 MG/5ML PO SOLN: 5.0000 mg | Freq: Once | ORAL | Status: DC | PRN

## 2017-07-30 MED ORDER — SCOPOLAMINE 1 MG/3DAYS TD PT72
1.0000 | MEDICATED_PATCH | TRANSDERMAL | Status: DC
  Administered 2017-07-30: 1.5 mg via TRANSDERMAL
  Filled 2017-07-30: qty 1

## 2017-07-30 MED ORDER — ONDANSETRON HCL 4 MG/2ML IJ SOLN: 4.0000 mg | INTRAMUSCULAR | Status: DC | PRN

## 2017-07-30 MED ORDER — PANTOPRAZOLE SODIUM 40 MG IV SOLR
40.0000 mg | Freq: Every day | INTRAVENOUS | Status: DC
  Administered 2017-07-30 – 2017-07-31 (×2): 40 mg via INTRAVENOUS
  Filled 2017-07-30 (×2): qty 40

## 2017-07-30 MED ORDER — ENOXAPARIN SODIUM 30 MG/0.3ML ~~LOC~~ SOLN
30.0000 mg | Freq: Two times a day (BID) | SUBCUTANEOUS | Status: DC
  Administered 2017-07-30 – 2017-08-01 (×4): 30 mg via SUBCUTANEOUS
  Filled 2017-07-30 (×4): qty 0.3

## 2017-07-30 MED ORDER — LIDOCAINE 2% (20 MG/ML) 5 ML SYRINGE
INTRAMUSCULAR | Status: DC | PRN
  Administered 2017-07-30: 100 mg via INTRAVENOUS

## 2017-07-30 MED ORDER — DEXAMETHASONE SODIUM PHOSPHATE 4 MG/ML IJ SOLN: 4.0000 mg | INTRAMUSCULAR | Status: DC

## 2017-07-30 SURGICAL SUPPLY — 60 items
APL SKNCLS STERI-STRIP NONHPOA (GAUZE/BANDAGES/DRESSINGS) ×1
APPLIER CLIP 5 13 M/L LIGAMAX5 (MISCELLANEOUS)
APPLIER CLIP ROT 13.4 12 LRG (CLIP) ×2
APR CLP LRG 13.4X12 ROT 20 MLT (CLIP) ×1
APR CLP MED LRG 5 ANG JAW (MISCELLANEOUS)
BAG LAPAROSCOPIC 12 15 PORT 16 (BASKET) ×1 IMPLANT
BAG RETRIEVAL 12/15 (BASKET) ×2
BANDAGE ADH SHEER 1  50/CT (GAUZE/BANDAGES/DRESSINGS) ×12 IMPLANT
BENZOIN TINCTURE PRP APPL 2/3 (GAUZE/BANDAGES/DRESSINGS) ×2 IMPLANT
BLADE SURG SZ11 CARB STEEL (BLADE) ×2 IMPLANT
CABLE HIGH FREQUENCY MONO STRZ (ELECTRODE) ×2 IMPLANT
CHLORAPREP W/TINT 26ML (MISCELLANEOUS) ×2 IMPLANT
CLIP APPLIE 5 13 M/L LIGAMAX5 (MISCELLANEOUS) IMPLANT
CLIP APPLIE ROT 13.4 12 LRG (CLIP) IMPLANT
COVER SURGICAL LIGHT HANDLE (MISCELLANEOUS) ×2 IMPLANT
DRAIN CHANNEL 19F RND (DRAIN) IMPLANT
DRAPE UNIVERSAL PACK (DRAPES) ×2 IMPLANT
ELECT REM PT RETURN 15FT ADLT (MISCELLANEOUS) ×2 IMPLANT
EVACUATOR SILICONE 100CC (DRAIN) IMPLANT
GAUZE SPONGE 4X4 12PLY STRL (GAUZE/BANDAGES/DRESSINGS) IMPLANT
GLOVE BIOGEL PI IND STRL 7.0 (GLOVE) ×1 IMPLANT
GLOVE BIOGEL PI INDICATOR 7.0 (GLOVE) ×1
GLOVE SURG SS PI 7.0 STRL IVOR (GLOVE) ×2 IMPLANT
GOWN STRL REUS W/TWL LRG LVL3 (GOWN DISPOSABLE) ×2 IMPLANT
GOWN STRL REUS W/TWL XL LVL3 (GOWN DISPOSABLE) ×6 IMPLANT
GRASPER SUT TROCAR 14GX15 (MISCELLANEOUS) ×2 IMPLANT
HANDLE STAPLE EGIA 4 XL (STAPLE) ×2 IMPLANT
HOVERMATT SINGLE USE (MISCELLANEOUS) ×2 IMPLANT
KIT BASIN OR (CUSTOM PROCEDURE TRAY) ×2 IMPLANT
MARKER SKIN DUAL TIP RULER LAB (MISCELLANEOUS) ×2 IMPLANT
NDL SPNL 22GX3.5 QUINCKE BK (NEEDLE) ×1 IMPLANT
NEEDLE SPNL 22GX3.5 QUINCKE BK (NEEDLE) ×2 IMPLANT
RELOAD EGIA 45 MED/THCK PURPLE (STAPLE) ×1 IMPLANT
RELOAD EGIA 60 MED/THCK PURPLE (STAPLE) ×8 IMPLANT
RELOAD EGIA BLACK ROTIC 45MM (STAPLE) IMPLANT
RELOAD STAPLE 45 BLK XTHK (STAPLE) IMPLANT
RELOAD STAPLE 60 BLK XTHK ART (STAPLE) ×2 IMPLANT
RELOAD STAPLE 60 MED/THCK ART (STAPLE) IMPLANT
RELOAD TRI 2.0 60 XTHK VAS SUL (STAPLE) ×4 IMPLANT
SCISSORS METZENBAUM CVD 44CM (INSTRUMENTS) IMPLANT
SET IRRIG TUBING LAPAROSCOPIC (IRRIGATION / IRRIGATOR) ×2 IMPLANT
SHEARS HARMONIC ACE PLUS 45CM (MISCELLANEOUS) ×2 IMPLANT
SLEEVE GASTRECTOMY 40FR VISIGI (MISCELLANEOUS) ×2 IMPLANT
SLEEVE XCEL OPT CAN 5 100 (ENDOMECHANICALS) ×4 IMPLANT
SOLUTION ANTI FOG 6CC (MISCELLANEOUS) ×2 IMPLANT
SPONGE LAP 18X18 X RAY DECT (DISPOSABLE) ×2 IMPLANT
STRIP CLOSURE SKIN 1/2X4 (GAUZE/BANDAGES/DRESSINGS) ×2 IMPLANT
SUT ETHIBOND 0 36 GRN (SUTURE) IMPLANT
SUT ETHILON 2 0 PS N (SUTURE) IMPLANT
SUT MNCRL AB 4-0 PS2 18 (SUTURE) ×2 IMPLANT
SUT VICRYL 0 TIES 12 18 (SUTURE) ×2 IMPLANT
SYR 20CC LL (SYRINGE) ×2 IMPLANT
SYR 50ML LL SCALE MARK (SYRINGE) ×2 IMPLANT
TOWEL OR 17X26 10 PK STRL BLUE (TOWEL DISPOSABLE) ×2 IMPLANT
TOWEL OR NON WOVEN STRL DISP B (DISPOSABLE) ×2 IMPLANT
TROCAR BLADELESS 15MM (ENDOMECHANICALS) ×2 IMPLANT
TROCAR BLADELESS OPT 5 100 (ENDOMECHANICALS) ×2 IMPLANT
TUBING CONNECTING 10 (TUBING) ×2 IMPLANT
TUBING ENDO SMARTCAP PENTAX (MISCELLANEOUS) IMPLANT
TUBING INSUF HEATED (TUBING) ×2 IMPLANT

## 2017-07-30 NOTE — Transfer of Care (Signed)
Immediate Anesthesia Transfer of Care Note  Patient: Sandra Jenkins  Procedure(s) Performed: LAPAROSCOPIC GASTRIC SLEEVE RESECTION, UPPER ENDO (N/A Abdomen)  Patient Location: PACU  Anesthesia Type:General  Level of Consciousness: sedated  Airway & Oxygen Therapy: Patient Spontanous Breathing and Patient connected to face mask oxygen  Post-op Assessment: Report given to RN and Post -op Vital signs reviewed and stable  Post vital signs: Reviewed and stable  Last Vitals:  Vitals:   07/30/17 0953  BP: 106/68  Pulse: 79  Resp: 16  Temp: 37 C  SpO2: 98%    Last Pain:  Vitals:   07/30/17 0953  TempSrc: Oral         Complications: No apparent anesthesia complications

## 2017-07-30 NOTE — Discharge Instructions (Signed)
° ° ° °GASTRIC BYPASS/SLEEVE ° Home Care Instructions ° ° These instructions are to help you care for yourself when you go home. ° °Call: If you have any problems. °• Call [phone] and ask for the surgeon on call °• If you need immediate help, come to the ER at [place].  °• Tell the ER staff that you are a new post-op gastric bypass or gastric sleeve patient °  °Signs and symptoms to report: • Severe vomiting or nausea °o If you cannot keep down clear liquids for longer than 1 day, call your surgeon  °• Abdominal pain that does not get better after taking your pain medication °• Fever over 100.4° F with chills °• Heart beating over 100 beats a minute °• Shortness of breath at rest °• Chest pain °•  Redness, swelling, drainage, or foul odor at incision (surgical) sites °•  If your incisions open or pull apart °• Swelling or pain in calf (lower leg) °• Diarrhea (Loose bowel movements that happen often), frequent watery, uncontrolled bowel movements °• Constipation, (no bowel movements for 3 days) if this happens: Pick one °o Milk of Magnesia, 2 tablespoons by mouth, 3 times a day for 2 days if needed °o Stop taking Milk of Magnesia once you have a bowel movement °o Call your doctor if constipation continues °Or °o Miralax  (instead of Milk of Magnesia) following the label instructions °o Stop taking Miralax once you have a bowel movement °o Call your doctor if constipation continues °• Anything you think is not normal °  °Normal side effects after surgery: • Unable to sleep at night or unable to focus °• Irritability or moody °• Being tearful (crying) or depressed °These are common complaints, possibly related to your anesthesia medications that put you to sleep, stress of surgery, and change in lifestyle.  This usually goes away a few weeks after surgery.  If these feelings continue, call your primary care doctor. °  °Wound Care: You may have surgical glue, steri-strips, or staples over your incisions after  surgery °• Surgical glue:  Looks like a clear film over your incisions and will wear off a little at a time °• Steri-strips: Strips of tape over your incisions. You may notice a yellowish color on the skin under the steri-strips. This is used to make the   steri-strips stick better. Do not pull the steri-strips off - let them fall off °• Staples: Staples may be removed before you leave the hospital °o If you go home with staples, call [facility], [phone] at for an appointment with your surgeon’s nurse to have staples removed 10 days after surgery. °• Showering: You may shower two (2) days after your surgery unless your surgeon tells you differently °o Wash gently around incisions with warm soapy water, rinse well, and gently pat dry  °o No tub baths until staples are removed, steri-strips fall off or glue is gone.  °  °Medications: • Medications should be liquid or crushed if larger than the size of a dime °• Extended release pills (medication that release a little bit at a time through the day) should NOT be crushed or cut. (examples include XL, ER, DR, SR) °• Depending on the size and number of medications you take, you may need to space (take a few throughout the day)/change the time you take your medications so that you do not over-fill your pouch (smaller stomach) °• Make sure you follow-up with your primary care doctor to   make medication changes needed during rapid weight loss and life-style changes °• If you have diabetes, follow up with the doctor that orders your diabetes medication(s) within one week after surgery and check your blood sugar regularly. °• Do not drive while taking prescription pain medication  °• It is ok to take Tylenol by the bottle instructions with your pain medicine or instead of your pain medicine as needed.  DO NOT TAKE NSAIDS (EXAMPLES OF NSAIDS:  IBUPROFREN/ NAPROXEN)  °Diet:                    First 2 Weeks ° You will see the dietician t about two (2) weeks  after your surgery. The dietician will increase the types of foods you can eat if you are handling liquids well: °• If you have severe vomiting or nausea and cannot keep down clear liquids lasting longer than 1 day, call your surgeon @ ([phone]) °Protein Shake °• Drink at least 2 ounces of shake 5-6 times per day °• Each serving of protein shakes (usually 8 - 12 ounces) should have: °o 15 grams of protein  °o And no more than 5 grams of carbohydrate  °• Goal for protein each day: °o Men = 80 grams per day °o Women = 60 grams per day °• Protein powder may be added to fluids such as non-fat milk or Lactaid milk or unsweetened Soy/Almond milk (limit to 35 grams added protein powder per serving) ° °Hydration °• Slowly increase the amount of water and other clear liquids as tolerated (See Acceptable Fluids) °• Slowly increase the amount of protein shake as tolerated  °•  Sip fluids slowly and throughout the day.  Do not use straws. °• May use sugar substitutes in small amounts (no more than 6 - 8 packets per day; i.e. Splenda) ° °Fluid Goal °• The first goal is to drink at least 8 ounces of protein shake/drink per day (or as directed by the nutritionist); some examples of protein shakes are Syntrax Nectar, Adkins Advantage, EAS Edge HP, and Unjury. See handout from pre-op Bariatric Education Class: °o Slowly increase the amount of protein shake you drink as tolerated °o You may find it easier to slowly sip shakes throughout the day °o It is important to get your proteins in first °• Your fluid goal is to drink 64 - 100 ounces of fluid daily °o It may take a few weeks to build up to this °• 32 oz (or more) should be clear liquids  °And  °• 32 oz (or more) should be full liquids (see below for examples) °• Liquids should not contain sugar, caffeine, or carbonation ° °Clear Liquids: °• Water or Sugar-free flavored water (i.e. Fruit H2O, Propel) °• Decaffeinated coffee or tea (sugar-free) °• Crystal Lite, Wyler’s Lite,  Minute Maid Lite °• Sugar-free Jell-O °• Bouillon or broth °• Sugar-free Popsicle:   *Less than 20 calories each; Limit 1 per day ° °Full Liquids: °Protein Shakes/Drinks + 2 choices per day of other full liquids °• Full liquids must be: °o No More Than 15 grams of Carbs per serving  °o No More Than 3 grams of Fat per serving °• Strained low-fat cream soup (except Cream of Potato or Tomato) °• Non-Fat milk °• Fat-free Lactaid Milk °• Unsweetened Soy Or Unsweetened Almond Milk °• Low Sugar yogurt (Dannon Lite & Fit, Greek yogurt; Oikos Triple Zero; Chobani Simply 100; Yoplait 100 calorie Greek - No Fruit on the Bottom) ° °  °Vitamins   and Minerals • Start 1 day after surgery unless otherwise directed by your surgeon °• 2 Chewable Bariatric Specific Multivitamin / Multimineral Supplement with iron (Example: Bariatric Advantage Multi EA) °• Chewable Calcium with Vitamin D-3 °(Example: 3 Chewable Calcium Plus 600 with Vitamin D-3) °o Take 500 mg three (3) times a day for a total of 1500 mg each day °o Do not take all 3 doses of calcium at one time as it may cause constipation, and you can only absorb 500 mg  at a time  °o Do not mix multivitamins containing iron with calcium supplements; take 2 hours apart °• Menstruating women and those with a history of anemia (a blood disease that causes weakness) may need extra iron °o Talk with your doctor to see if you need more iron °• Do not stop taking or change any vitamins or minerals until you talk to your dietitian or surgeon °• Your Dietitian and/or surgeon must approve all vitamin and mineral supplements °  °Activity and Exercise: Limit your physical activity as instructed by your doctor.  It is important to continue walking at home.  During this time, use these guidelines: °• Do not lift anything greater than ten (10) pounds for at least two (2) weeks °• Do not go back to work or drive until your surgeon says you can °• You may have sex when you feel comfortable  °o It is  VERY important for female patients to use a reliable birth control method; fertility often increases after surgery  °o All hormonal birth control will be ineffective for 30 days after surgery due to medications given during surgery a barrier method must be used. °o Do not get pregnant for at least 18 months °• Start exercising as soon as your doctor tells you that you can °o Make sure your doctor approves any physical activity °• Start with a simple walking program °• Walk 5-15 minutes each day, 7 days per week.  °• Slowly increase until you are walking 30-45 minutes per day °Consider joining our BELT program. [phone] or email [email or web address] °  °Special Instructions Things to remember: °• Use your CPAP when sleeping if this applies to you ° °• [facility] has two free Bariatric Surgery Support Groups that meet monthly °o The 3rd Thursday of each month, 6 pm, [facility]  °o The 2nd Friday of each month, 11:45 am in the private dining room in the basement of [facility] [place] °• It is very important to keep all follow up appointments with your surgeon, dietitian, primary care physician, and behavioral health practitioner °• Routine follow up schedule with your surgeon include appointments at 2-3 weeks, 6-8 weeks, 6 months, and 1 year at a minimum.  Your surgeon may request to see you more often.   °o After the first year, please follow up with your bariatric surgeon and dietitian at least once a year in order to maintain best weight loss results °[facility]: [phone] °[facility]: [phone] °Bariatric Nurse Coordinator: [phone] °  °   Reviewed and Endorsed  °by [facility] Patient Education Committee, June, 2016 °Edits Approved: Aug, 2018 ° ° ° °

## 2017-07-30 NOTE — Op Note (Signed)
Preoperative diagnosis: laparoscopic sleeve gastrectomy  Postoperative diagnosis: Same   Procedure: Upper endoscopy   Surgeon: Clovis Riley, M.D.  Anesthesia: Gen.   Indications for procedure: This patient was undergoing a laparoscopic sleeve gastrectomy.   Description of procedure: The endoscopy was placed in the mouth and into the oropharynx and under endoscopic vision it was advanced to the esophagogastric junction. The pouch was insufflated and no bleeding or bubbles were seen. The GEJ was identified at 38cm from the teeth. No bleeding or leaks were detected. The scope was withdrawn without difficulty.   Clovis Riley, M.D. General, Bariatric, & Minimally Invasive Surgery [facility]

## 2017-07-30 NOTE — Anesthesia Postprocedure Evaluation (Signed)
Anesthesia Post Note  Patient: Naoma Diener  Procedure(s) Performed: LAPAROSCOPIC GASTRIC SLEEVE RESECTION, UPPER ENDO (N/A Abdomen)     Patient location during evaluation: PACU Anesthesia Type: General Level of consciousness: awake and alert Pain management: pain level controlled Vital Signs Assessment: post-procedure vital signs reviewed and stable Respiratory status: spontaneous breathing, nonlabored ventilation and respiratory function stable Cardiovascular status: blood pressure returned to baseline and stable Postop Assessment: no apparent nausea or vomiting Anesthetic complications: no    Last Vitals:  Vitals:   07/30/17 1245 07/30/17 1300  BP: (!) 141/95 (!) 140/91  Pulse: 89 80  Resp: (!) 26 14  Temp:    SpO2: 100% 100%    Last Pain:  Vitals:   07/30/17 1310  TempSrc:   PainSc: [place]

## 2017-07-30 NOTE — Progress Notes (Signed)
   07/30/17 1917  Intake (mL)  P.O. 60 mL (water)  Patient started her fluid intake well.  She c/o no nausea/vomiting.

## 2017-07-30 NOTE — Anesthesia Preprocedure Evaluation (Addendum)
Anesthesia Evaluation  Patient identified by MRN, date of birth, ID band Patient awake    Reviewed: Allergy & Precautions, H&P , NPO status , Patient's Chart, lab work & pertinent test results, reviewed documented beta blocker date and time   Airway Mallampati: III  TM Distance: >3 FB Neck ROM: full    Dental no notable dental hx. (+) Teeth Intact, Dental Advisory Given   Pulmonary neg pulmonary ROS,    breath sounds clear to auscultation       Cardiovascular negative cardio ROS Normal cardiovascular exam Rhythm:Regular Rate:Normal     Neuro/Psych negative neurological ROS  negative psych ROS   GI/Hepatic Neg liver ROS, GERD  Medicated and Controlled,  Endo/Other  Morbid obesityPre-DM  Renal/GU negative Renal ROS  negative genitourinary   Musculoskeletal  (+) Arthritis , Lumbar disc disease   Abdominal (+) + obese,   Peds  Hematology  (+) Sickle cell trait and anemia ,   Anesthesia Other Findings   Reproductive/Obstetrics negative OB ROS                            Anesthesia Physical  Anesthesia Plan  ASA: III  Anesthesia Plan: General   Post-op Pain Management:    Induction: Intravenous  PONV Risk Score and Plan: 4 or greater and Treatment may vary due to age or medical condition, Scopolamine patch - Pre-op, Midazolam, Dexamethasone and Ondansetron  Airway Management Planned: Oral ETT  Additional Equipment: None  Intra-op Plan:   Post-operative Plan: Extubation in OR  Informed Consent: I have reviewed the patients History and Physical, chart, labs and discussed the procedure including the risks, benefits and alternatives for the proposed anesthesia with the patient or authorized representative who has indicated his/her understanding and acceptance.   Dental advisory given  Plan Discussed with: CRNA and Surgeon  Anesthesia Plan Comments:         Anesthesia Quick  Evaluation

## 2017-07-30 NOTE — H&P (Signed)
Sandra Jenkins is an 47 y.o. female.   Chief Complaint: obesity HPI: 47 yo female with obesity. SHe has completed all requirements and is ready to proceed with sleeve gastrectomy.  Past Medical History:  Diagnosis Date  . Anemia    Sickle Cell Trait  . Arthritis    BILATERAL KNEES; DDD lumbar spine  . DDD (degenerative disc disease), lumbar   . GERD (gastroesophageal reflux disease)   . Pre-diabetes     Past Surgical History:  Procedure Laterality Date  . BREAST SURGERY     breast reduction  . CESAREAN SECTION    . REDUCTION MAMMAPLASTY Bilateral 2004  . TUBAL LIGATION      Family History  Problem Relation Age of Onset  . Heart disease Maternal Grandmother   . Heart attack Maternal Grandmother   . Heart disease Maternal Grandfather   . Heart attack Maternal Grandfather   . Heart disease Paternal Grandmother   . Heart attack Paternal Grandmother   . Arthritis Father    Social History:  reports that  has never smoked. she has never used smokeless tobacco. She reports that she drinks about 1.2 - 1.8 oz of alcohol per week. She reports that she does not use drugs.  Allergies: No Known Allergies  Medications Prior to Admission  Medication Sig Dispense Refill  . Calcium Carbonate-Vitamin D (CALCIUM 500 + D PO) Take 1 tablet by mouth 2 (two) times daily.    Marland Kitchen ibuprofen (ADVIL,MOTRIN) 200 MG tablet Take 400 mg by mouth 2 (two) times daily as needed for moderate pain.    . Iron-Folic Acid-Vit V77 (IRON FORMULA PO) Take 1 tablet by mouth daily.    . Multiple Vitamin (MULTIVITAMIN WITH MINERALS) TABS tablet Take 1 tablet by mouth daily.      Results for orders placed or performed during the hospital encounter of 07/29/17 (from the past 48 hour(s))  Type and screen     Status: None   Collection Time: 07/29/17  2:02 PM  Result Value Ref Range   ABO/RH(D) O POS    Antibody Screen NEG    Sample Expiration 08/02/2017    Extend sample reason NO TRANSFUSIONS OR PREGNANCY IN THE  PAST 3 MONTHS   ABO/Rh     Status: None   Collection Time: 07/29/17  2:02 PM  Result Value Ref Range   ABO/RH(D) O POS   CBC WITH DIFFERENTIAL     Status: Abnormal   Collection Time: 07/29/17  2:05 PM  Result Value Ref Range   WBC 6.7 4.0 - 10.5 K/uL   RBC 4.44 3.87 - 5.11 MIL/uL   Hemoglobin 11.6 (L) 12.0 - 15.0 g/dL   HCT 35.1 (L) 36.0 - 46.0 %   MCV 79.1 78.0 - 100.0 fL   MCH 26.1 26.0 - 34.0 pg   MCHC 33.0 30.0 - 36.0 g/dL   RDW 14.5 11.5 - 15.5 %   Platelets 290 150 - 400 K/uL   Neutrophils Relative % 47 %   Neutro Abs 3.1 1.7 - 7.7 K/uL   Lymphocytes Relative 47 %   Lymphs Abs 3.2 0.7 - 4.0 K/uL   Monocytes Relative 5 %   Monocytes Absolute 0.3 0.1 - 1.0 K/uL   Eosinophils Relative 1 %   Eosinophils Absolute 0.1 0.0 - 0.7 K/uL   Basophils Relative 0 %   Basophils Absolute 0.0 0.0 - 0.1 K/uL  Comprehensive metabolic panel     Status: None   Collection Time: 07/29/17  2:05 PM  Result Value Ref Range   Sodium 139 135 - 145 mmol/L   Potassium 3.7 3.5 - 5.1 mmol/L   Chloride 107 101 - 111 mmol/L   CO2 27 22 - 32 mmol/L   Glucose, Bld 79 65 - 99 mg/dL   BUN 15 6 - 20 mg/dL   Creatinine, Ser 0.76 0.44 - 1.00 mg/dL   Calcium 8.9 8.9 - 10.3 mg/dL   Total Protein 7.1 6.5 - 8.1 g/dL   Albumin 3.8 3.5 - 5.0 g/dL   AST 17 15 - 41 U/L   ALT 14 14 - 54 U/L   Alkaline Phosphatase 70 38 - 126 U/L   Total Bilirubin 0.7 0.3 - 1.2 mg/dL   GFR calc non Af Amer >60 >60 mL/min   GFR calc Af Amer >60 >60 mL/min    Comment: (NOTE) The eGFR has been calculated using the CKD EPI equation. This calculation has not been validated in all clinical situations. eGFR's persistently <60 mL/min signify possible Chronic Kidney Disease.    Anion gap 5 5 - 15  Hemoglobin A1c     Status: None   Collection Time: 07/29/17  2:05 PM  Result Value Ref Range   Hgb A1c MFr Bld 5.6 4.8 - 5.6 %    Comment: (NOTE) Pre diabetes:          5.7%-6.4% Diabetes:              >6.4% Glycemic control for    <7.0% adults with diabetes    Mean Plasma Glucose 114.02 mg/dL    Comment: Performed at [facility] [address] [place], [place] [address]   No results found.  Review of Systems  Constitutional: Negative for chills and fever.  HENT: Negative for hearing loss.   Eyes: Negative for blurred vision and double vision.  Respiratory: Negative for cough and hemoptysis.   Cardiovascular: Negative for chest pain and palpitations.  Gastrointestinal: Negative for abdominal pain, nausea and vomiting.  Genitourinary: Negative for dysuria and urgency.  Musculoskeletal: Negative for myalgias and neck pain.  Skin: Negative for itching and rash.  Neurological: Negative for dizziness, tingling and headaches.  Endo/Heme/Allergies: Does not bruise/bleed easily.  Psychiatric/Behavioral: Negative for depression and suicidal ideas.    Blood pressure 106/68, pulse 79, temperature 98.6 F (37 C), temperature source Oral, resp. rate 16, height 5' 4" (1.626 m), weight 118.6 kg (261 lb 8 oz), last menstrual period 07/19/2017, SpO2 98 %. Physical Exam  Vitals reviewed. Constitutional: She is oriented to person, place, and time. She appears well-developed and well-nourished.  HENT:  Head: Normocephalic and atraumatic.  Eyes: Conjunctivae and EOM are normal. Pupils are equal, round, and reactive to light.  Neck: Normal range of motion. Neck supple.  Cardiovascular: Normal rate and regular rhythm.  Respiratory: Effort normal and breath sounds normal.  GI: Soft. Bowel sounds are normal. She exhibits no distension. There is no tenderness.  Musculoskeletal: Normal range of motion.  Neurological: She is alert and oriented to person, place, and time.  Skin: Skin is warm and dry.  Psychiatric: She has a normal mood and affect. Her behavior is normal.     Assessment/Plan 47 yo female with morbid obesity -lap sleeve gastrectomy -eras protocol -bariatric protocol  Mickeal Skinner,  MD 07/30/2017, 10:44 AM

## 2017-07-30 NOTE — Op Note (Signed)
Preop Diagnosis: Obesity Class III  Postop Diagnosis: same  Procedure performed: laparoscopic Sleeve Gastrectomy  Assitant: Romana Juniper  Indications:  The patient is a 47 y.o. year-old morbidly obese female who has been followed in the Bariatric Clinic as an outpatient. This patient was diagnosed with morbid obesity with a BMI of Body mass index is 44.89 kg/m. and significant co-morbidities including GERD.  The patient was counseled extensively in the Bariatric Outpatient Clinic and after a thorough explanation of the risks and benefits of surgery (including death from complications, bowel leak, infection such as peritonitis and/or sepsis, internal hernia, bleeding, need for blood transfusion, bowel obstruction, organ failure, pulmonary embolus, deep venous thrombosis, wound infection, incisional hernia, skin breakdown, and others entailed on the consent form) and after a compliant diet and exercise program, the patient was scheduled for an elective laparoscopic sleeve gastrectomy.  Description of Operation:  Following informed consent, the patient was taken to the operating room and placed on the operating table in the supine position.  She had previously received prophylactic antibiotics and subcutaneous heparin for DVT prophylaxis in the pre-op holding area.  After induction of general endotracheal anesthesia by the anesthesiologist, the patient underwent placement of sequential compression devices, Foley catheter and an oro-gastric tube.  A timeout was confirmed by the surgery and anesthesia teams.  The patient was adequately padded at all pressure points and placed on a footboard to prevent slippage from the OR table during extremes of position during surgery.  She underwent a routine sterile prep and drape of her entire abdomen.    Next, A transverse incision was made under the left subcostal area and a 57mm optical viewing trocar was introduced into the peritoneal cavity. Pneumoperitoneum was  applied with a high flow and low pressure. A laparoscope was inserted to confirm placement. A extraperitoneal block was then placed at the lateral abdominal wall using exparel diluted with marcaine. 5 additional incisions were placed: 1 34mm trocar to the left of the midline. 1 additional 4mm trocar in the left lateral area, 1 61mm trocar in the right mid abdomen, 1 53mm trocar in the right subcostal area, and a Nathanson retractor was placed through a subxiphoid incision.  Due to concern of hiatal hernia, the sizer was passed and used to test the size of hiatus. The hiatus was determined to be the appropriate size.  The fat pad at the GE junction was incised and the gastrodiaphragmatic ligament was divided using the Harmonic scalpel. Next, a hole was created through the lesser omentum along the greater curve of the stomach to enter the lesser sac. The vessels along the greater omentum were  Then ligated and divided using the Harmonic scalpel moving towards the spleen and then short gastric vessels were ligated and divided in the same fashion to fully mobilize the fundus. The left crus was identified to ensure completion of the dissection. Next the antrum was measured and dissection continued inferiorly along the greater curve towards the pylorus and stopped 6cm from the pylorus.   A 40Fr ViSiGi dilator was placed into the esophgaus and along the lesser curve of the stomach and placed on suction. 1 non-reinforced 79mm 4-45mm tristapler(s) followed by 4 25mm 3-59mm tristaplers and 1 4mm 3-4 mm tristapler were used to make the resection along the antrum being sure to stay well away from the angularis by angling the jaws of the stapler towards the greater curve and later completing the resection staying along the [place] and ensuring the fundus was not  retained by appropriately retracting it lateral. Air was inserted through the [place] to perform a leak test showing no bubbles and a neutral lie of the stomach.  The  assistant then went and performed an upper endoscopy and leak test. No bubbles were seen and the sleeve and antrum distended appropriately. The specimen was then placed in an endocatch bag and removed by the 13mm port. The fascia of the 75mm port was closed with a 0 vicryl by suture passer. Hemostasis was ensured. Pneumoperitoneum was evacuated, all ports were removed and all incisions closed with 4-0 monocryl suture in subcuticular fashion. Steristrips and bandaids were put in place for dressing. The patient awoke from anesthesia and was brought to pacu in stable condition. All counts were correct.  Estimated blood loss: <70ml  Specimens:  Sleeve gastrectomy  Local Anesthesia: 50 ml Exparel:0.5% Marcaine mix  Post-Op Plan:       Pain Management: PO, prn      Antibiotics: Prophylactic      Anticoagulation: Prophylactic, Starting now      Post Op Studies/Consults: Not applicable      Intended Discharge: within 48h      Intended Outpatient Follow-Up: Two Week      Intended Outpatient Studies: Not Applicable      Other: Not Applicable   Sandra Jenkins

## 2017-07-30 NOTE — Progress Notes (Signed)
Post op day goals discussed with bedside RN as patient was sleeping.  including ambulation, IS, diet progression, pain, and nausea control.  Family at bedside with no questions at this time.

## 2017-07-30 NOTE — Anesthesia Procedure Notes (Signed)
Procedure Name: Intubation Date/Time: 07/30/2017 11:00 AM Performed by: Lind Covert, CRNA Pre-anesthesia Checklist: Patient identified, Emergency Drugs available, Suction available, Patient being monitored and Timeout performed Patient Re-evaluated:Patient Re-evaluated prior to induction Oxygen Delivery Method: Circle system utilized Preoxygenation: Pre-oxygenation with 100% oxygen Induction Type: IV induction Ventilation: Mask ventilation without difficulty Laryngoscope Size: Mac and 4 Grade View: Grade I Tube size: 7.0 mm Number of attempts: 1 Airway Equipment and Method: Stylet Placement Confirmation: ETT inserted through vocal cords under direct vision,  positive ETCO2 and breath sounds checked- equal and bilateral Secured at: 21 cm Tube secured with: Tape Dental Injury: Teeth and Oropharynx as per pre-operative assessment

## 2017-07-30 NOTE — Progress Notes (Signed)
Pt started water. 

## 2017-07-31 ENCOUNTER — Encounter (HOSPITAL_COMMUNITY): Payer: Self-pay | Admitting: General Surgery

## 2017-07-31 LAB — CBC WITH DIFFERENTIAL/PLATELET
Basophils Absolute: 0 10*3/uL (ref 0.0–0.1)
Basophils Relative: 0 %
Eosinophils Absolute: 0 10*3/uL (ref 0.0–0.7)
Eosinophils Relative: 0 %
HCT: 31.8 % — ABNORMAL LOW (ref 36.0–46.0)
HEMOGLOBIN: 10.6 g/dL — AB (ref 12.0–15.0)
LYMPHS ABS: 2.1 10*3/uL (ref 0.7–4.0)
LYMPHS PCT: 21 %
MCH: 26.4 pg (ref 26.0–34.0)
MCHC: 33.3 g/dL (ref 30.0–36.0)
MCV: 79.3 fL (ref 78.0–100.0)
Monocytes Absolute: 0.5 10*3/uL (ref 0.1–1.0)
Monocytes Relative: 5 %
NEUTROS PCT: 74 %
Neutro Abs: 7.1 10*3/uL (ref 1.7–7.7)
Platelets: 296 10*3/uL (ref 150–400)
RBC: 4.01 MIL/uL (ref 3.87–5.11)
RDW: 14.6 % (ref 11.5–15.5)
WBC: 9.7 10*3/uL (ref 4.0–10.5)

## 2017-07-31 LAB — COMPREHENSIVE METABOLIC PANEL
ALK PHOS: 58 U/L (ref 38–126)
ALT: 15 U/L (ref 14–54)
AST: 17 U/L (ref 15–41)
Albumin: 3.3 g/dL — ABNORMAL LOW (ref 3.5–5.0)
Anion gap: 6 (ref 5–15)
BUN: 9 mg/dL (ref 6–20)
CALCIUM: 8.2 mg/dL — AB (ref 8.9–10.3)
CO2: 27 mmol/L (ref 22–32)
Chloride: 104 mmol/L (ref 101–111)
Creatinine, Ser: 0.83 mg/dL (ref 0.44–1.00)
Glucose, Bld: 131 mg/dL — ABNORMAL HIGH (ref 65–99)
Potassium: 3.7 mmol/L (ref 3.5–5.1)
Sodium: 137 mmol/L (ref 135–145)
Total Bilirubin: 0.4 mg/dL (ref 0.3–1.2)
Total Protein: 6.6 g/dL (ref 6.5–8.1)

## 2017-07-31 MED ORDER — LIP MEDEX EX OINT
TOPICAL_OINTMENT | CUTANEOUS | Status: AC
  Filled 2017-07-31: qty 7

## 2017-07-31 NOTE — Progress Notes (Signed)
Patient alert and oriented, Post op day 1.  Provided support and encouragement.  Encouraged pulmonary toilet, ambulation and small sips of liquids.  Completed 12 ounces of clear fluid overnight started protein this am.  All questions answered.  Will continue to monitor.

## 2017-07-31 NOTE — Progress Notes (Signed)
Pt's pain is controlled with Oxycodone 10 mg. States she feels much better.  Will continue to monitor.

## 2017-08-01 LAB — CBC WITH DIFFERENTIAL/PLATELET
BASOS ABS: 0 10*3/uL (ref 0.0–0.1)
Basophils Relative: 0 %
EOS ABS: 0 10*3/uL (ref 0.0–0.7)
EOS PCT: 0 %
HCT: 22.9 % — ABNORMAL LOW (ref 36.0–46.0)
HEMOGLOBIN: 7.6 g/dL — AB (ref 12.0–15.0)
Lymphocytes Relative: 41 %
Lymphs Abs: 3.9 10*3/uL (ref 0.7–4.0)
MCH: 27.1 pg (ref 26.0–34.0)
MCHC: 34.1 g/dL (ref 30.0–36.0)
MCV: 79.5 fL (ref 78.0–100.0)
Monocytes Absolute: 0.8 10*3/uL (ref 0.1–1.0)
Monocytes Relative: 8 %
NEUTROS PCT: 51 %
Neutro Abs: 4.8 10*3/uL (ref 1.7–7.7)
PLATELETS: 213 10*3/uL (ref 150–400)
RBC: 2.88 MIL/uL — ABNORMAL LOW (ref 3.87–5.11)
RDW: 14.9 % (ref 11.5–15.5)
WBC: 9.5 10*3/uL (ref 4.0–10.5)

## 2017-08-01 LAB — HEMOGLOBIN AND HEMATOCRIT, BLOOD
HEMATOCRIT: 23.4 % — AB (ref 36.0–46.0)
HEMOGLOBIN: 7.8 g/dL — AB (ref 12.0–15.0)

## 2017-08-01 MED ORDER — OXYCODONE HCL 5 MG/5ML PO SOLN: 5.0000 mg | Freq: Four times a day (QID) | ORAL | 0 refills | Status: DC | PRN

## 2017-08-01 NOTE — Progress Notes (Signed)
   Progress Note: Metabolic and Bariatric Surgery Service   Chief Complaint/Subjective: Pain much improved, tolerated 3 cups shake this morning  Objective: Vital signs in last 24 hours: Temp:  [98.5 F (36.9 C)-100.6 F (38.1 C)] 98.9 F (37.2 C) (01/24 0525) Pulse Rate:  [73-90] 90 (01/24 0525) Resp:  [16-17] 16 (01/24 0525) BP: (90-115)/(52-69) 110/69 (01/24 0525) SpO2:  [92 %-99 %] 97 % (01/24 0525) Weight:  [119.6 kg (263 lb 11.2 oz)] 119.6 kg (263 lb 11.2 oz) (01/23 1705) Last BM Date: 07/29/17  Intake/Output from previous day: 01/23 0701 - 01/24 0700 In: 3800 [P.O.:300; I.V.:3500] Out: -  Intake/Output this shift: No intake/output data recorded.  Lungs: CTAb  Cardiovascular: RRR  Abd: soft, ATTP, ND  Extremities: no edema  Neuro: AOx4  Lab Results: CBC  Recent Labs    07/31/17 0446 08/01/17 0438  WBC 9.7 9.5  HGB 10.6* 7.6*  HCT 31.8* 22.9*  PLT 296 213   BMET Recent Labs    07/29/17 1405 07/31/17 0446  NA 139 137  K 3.7 3.7  CL 107 104  CO2 27 27  GLUCOSE 79 131*  BUN 15 9  CREATININE 0.76 0.83  CALCIUM 8.9 8.2*   PT/INR No results for input(s): LABPROT, INR in the last 72 hours. ABG No results for input(s): PHART, HCO3 in the last 72 hours.  Invalid input(s): PCO2, PO2  Studies/Results:  Anti-infectives: Anti-infectives (From admission, onward)   Start     Dose/Rate Route Frequency Ordered Stop   07/30/17 0907  cefoTEtan in Dextrose 5% (CEFOTAN) IVPB 2 g     2 g Intravenous On call to O.R. 07/30/17 0908 07/30/17 1105      Medications: Scheduled Meds: . acetaminophen (TYLENOL) oral liquid 160 mg/5 mL  650 mg Oral Q6H  . enoxaparin (LOVENOX) injection  30 mg Subcutaneous Q12H  . gabapentin  200 mg Oral Q12H  . pantoprazole (PROTONIX) IV  40 mg Intravenous QHS  . protein supplement shake  2 oz Oral Q2H   Continuous Infusions: . sodium chloride 125 mL/hr at 08/01/17 0512   PRN Meds:.hydrALAZINE, morphine injection,  ondansetron (ZOFRAN) IV, oxyCODONE, simethicone, traMADol  Assessment/Plan: Patient Active Problem List   Diagnosis Date Noted  . Primary osteoarthritis of right knee 01/29/2017  . Degenerative disc disease, lumbar 01/29/2017  . Morbid obesity ([place]) 08/07/2013   s/p Procedure(s): LAPAROSCOPIC GASTRIC SLEEVE RESECTION, UPPER ENDO 07/30/2017 -recheck H+H at noon today -continue to ambulate -home this afternoon if H+H better  Disposition:  LOS: 2 days  The patient will be in the hospital for normal postop protocol  Mickeal Skinner, MD [phone] [facility]

## 2017-08-01 NOTE — Progress Notes (Signed)
Patient alert and oriented, pain is controlled. Patient is tolerating fluids, advanced to protein shake today, patient is tolerating well.  Reviewed Gastric sleeve discharge instructions with patient and patient is able to articulate understanding.  Provided information on BELT program, Support Group and [facility] outpatient pharmacy. All questions answered, will continue to monitor.  

## 2017-08-01 NOTE — Progress Notes (Signed)
Discharge instructions discussed with patient, verbalized agreement and understanding, discussed s/sx of bleeding

## 2017-08-02 ENCOUNTER — Telehealth: Payer: Self-pay

## 2017-08-05 ENCOUNTER — Telehealth (HOSPITAL_COMMUNITY): Payer: Self-pay

## 2017-08-05 NOTE — Telephone Encounter (Signed)
Transition Care Management Follow-up Telephone Call   Date discharged? 08/01/17   How have you been since you were released from the hospital? Patient is doing fine but is feeling weak. She feels she needs her hemoglobin rechecked.    Do you understand why you were in the hospital? yes   Do you understand the discharge instructions? yes   Where were you discharged to? home   Items Reviewed:  Medications reviewed: yes  Allergies reviewed: yes  Dietary changes reviewed: yes  Referrals reviewed: yes   Functional Questionnaire:   Activities of Daily Living (ADLs):   She states they are independent in the following: ambulation, bathing and hygiene, feeding, continence, grooming, toileting and dressing States they require assistance with the following: none   Any transportation issues/concerns?: no   Any patient concerns? no   Confirmed importance and date/time of follow-up visits scheduled yes  Provider Appointment booked with Dr. Tamala Julian on 08/10/17 @ 3:20 pm.   Confirmed with patient if condition begins to worsen call PCP or go to the ER.  Patient was given the office number and encouraged to call back with question or concerns.  : yes

## 2017-08-05 NOTE — Telephone Encounter (Signed)
Patient called to discuss post bariatric surgery follow up questions.  See below:   1.  Tell me about your pain and pain management?has not used any pain medication since Friday after surgery, does report some "pulling" type pain and pain at her largest incision.  We discussed that she may have pain at that incison due to being larger and stomach being removed from that area.  Pulling pain is surgical related. 2.  Let's talk about fluid intake.  How much total fluid are you taking in?47 ounces of fluid  3.  How much protein have you taken in the last 2 days?30 grams of protein  4.  Have you had nausea?  Tell me about when have experienced nausea and what you did to help?nauseated after trying 1.5 shakes.  She took medication which helped.  She has since cut back to one shake a day.  Encouraged to slowly increase protein as tolerated  5.  Has the frequency or color changed with your urine?light colored urine with frequent urination  6.  Tell me what your incisions look like?look good  7.  Have you been passing gas? BM?has had 3 BMs since release without issue.  8.  If a problem or question were to arise who would you call?  Do you know contact numbers for [place], [facility], and [facility]?knows how to contact all services  9.  How has the walking going?ambulating in apartment frequently  10.  How are your vitamins and calcium going?  How are you taking them?No problems with vitamins or calcium at this time.  On the correct schedule

## 2017-08-07 ENCOUNTER — Telehealth (HOSPITAL_COMMUNITY): Payer: Self-pay

## 2017-08-07 ENCOUNTER — Emergency Department (HOSPITAL_COMMUNITY): Admission: EM | Admit: 2017-08-07 | Discharge: 2017-08-07 | Discharge status: Against Medical Advice | Payer: 59

## 2017-08-07 ENCOUNTER — Telehealth: Payer: Self-pay | Admitting: Surgery

## 2017-08-07 ENCOUNTER — Other Ambulatory Visit: Payer: Self-pay | Admitting: General Surgery

## 2017-08-07 DIAGNOSIS — D62 Acute posthemorrhagic anemia: Secondary | ICD-10-CM

## 2017-08-07 NOTE — Telephone Encounter (Signed)
Called by outpatient laboratory [facility].  Patient CBC reveals a white count of 12.1 marginally elevated.  Hemoglobin 9.2.  Underwhelming.  Hemoglobin improved from discharge of 7.8.  Will make operating surgeon, Dr. Kieth Brightly, aware

## 2017-08-07 NOTE — ED Notes (Signed)
Per registration, patient reports she is leaving. ?

## 2017-08-07 NOTE — Telephone Encounter (Signed)
Patient called from ED around 1500 stated she does not want to stay in ED and wants labs to be completed outpatient.  After surgeon discussion with [place], orders placed at office for CBC.

## 2017-08-07 NOTE — Telephone Encounter (Signed)
Patient called this am with concerns of continued dizziness and weakness. I spoke with surgeon and the decision was made to bring patient to the Emergency Department.  Patient made aware via telephone to come to [facility] Emergency Department.

## 2017-08-10 ENCOUNTER — Encounter: Payer: Self-pay | Admitting: Family Medicine

## 2017-08-10 ENCOUNTER — Ambulatory Visit (INDEPENDENT_AMBULATORY_CARE_PROVIDER_SITE_OTHER): Payer: 59 | Admitting: Family Medicine

## 2017-08-10 VITALS — BP 110/70 | HR 100 | Temp 98.2°F | Resp 16 | Ht 64.0 in | Wt 244.0 lb

## 2017-08-10 DIAGNOSIS — R5383 Other fatigue: Secondary | ICD-10-CM | POA: Diagnosis not present

## 2017-08-10 DIAGNOSIS — D508 Other iron deficiency anemias: Secondary | ICD-10-CM | POA: Diagnosis not present

## 2017-08-10 DIAGNOSIS — K5901 Slow transit constipation: Secondary | ICD-10-CM | POA: Diagnosis not present

## 2017-08-10 DIAGNOSIS — Z9884 Bariatric surgery status: Secondary | ICD-10-CM

## 2017-08-10 DIAGNOSIS — L244 Irritant contact dermatitis due to drugs in contact with skin: Secondary | ICD-10-CM

## 2017-08-10 LAB — POCT URINALYSIS DIP (MANUAL ENTRY)
Glucose, UA: NEGATIVE mg/dL
NITRITE UA: NEGATIVE
PH UA: 5.5 (ref 5.0–8.0)
Protein Ur, POC: 30 mg/dL — AB
Spec Grav, UA: 1.025 (ref 1.010–1.025)
UROBILINOGEN UA: 0.2 U/dL

## 2017-08-10 LAB — POCT CBC
Granulocyte percent: 70.8 %G (ref 37–80)
HEMATOCRIT: 32.2 % — AB (ref 37.7–47.9)
Hemoglobin: 10.4 g/dL — AB (ref 12.2–16.2)
Lymph, poc: 2.4 (ref 0.6–3.4)
MCH, POC: 26.5 pg — AB (ref 27–31.2)
MCHC: 32.4 g/dL (ref 31.8–35.4)
MCV: 81.8 fL (ref 80–97)
MID (cbc): 0.5 (ref 0–0.9)
MPV: 7.9 fL (ref 0–99.8)
PLATELET COUNT, POC: 515 10*3/uL — AB (ref 142–424)
POC GRANULOCYTE: 6.9 (ref 2–6.9)
POC LYMPH PERCENT: 24.2 %L (ref 10–50)
POC MID %: 5 %M (ref 0–12)
RBC: 3.93 M/uL — AB (ref 4.04–5.48)
RDW, POC: 14.5 %
WBC: 9.8 10*3/uL (ref 4.6–10.2)

## 2017-08-10 LAB — GLUCOSE, POCT (MANUAL RESULT ENTRY): POC Glucose: 82 mg/dl (ref 70–99)

## 2017-08-10 MED ORDER — IRON FORMULA 27-400-100 MG-MCG-MCG PO CAPS: 1.0000 | ORAL_CAPSULE | Freq: Every day | ORAL | 1 refills | Status: DC

## 2017-08-10 MED ORDER — TRIAMCINOLONE ACETONIDE 0.1 % EX CREA: 1.0000 "application " | TOPICAL_CREAM | Freq: Two times a day (BID) | CUTANEOUS | 0 refills | Status: AC

## 2017-08-10 NOTE — Progress Notes (Signed)
Subjective:    Patient ID: Sandra Jenkins, female    DOB: 07-26-1970, 47 y.o.   MRN: [id number]  08/10/2017  Transitions Of Care (Surgery)    HPI This 47 y.o. female presents for evaluation of [place] for gastric bypass.   No energy.  Sleeping a lot.  Drinking protein shakes and plenty of water.  No energy.  Trying to monitor with new gastric bypass surgery.  Adjusting new sensations with energy.  Discharge date nine days ago; Hgb 7.8 on 08/01/17.  Spoke with RN [employer] who recommended iron supplement or blood transfusion.  Hemoglobin 1/30 was 9.2.  Has a lot of steps at home.  If goes down steps, really exhausting.   Getting 36 ounces of water in per day.  1 protein shake per day.   Seeing nutritionist once monthly. Starting weight: 270# Knee pain gone; back pain gone. S/p laparoscopic sleeve gastrectomy. No pain medication; talking crazy on opiates so discontinued quickly. Not tolerating Miralax well. Some dizziness.    Admit date: 07/30/2017 Discharge date: 08/15/2017 Recommendations for Outpatient Follow-up:  1.  (include homehealth, outpatient follow-up instructions, specific recommendations for PCP to follow-up on, etc.)     Follow-up Information    Kinsinger, Arta Bruce, MD. Go on 08/23/2017.   Specialty:  General Surgery Why:  at 9:15am Contact information: [place] [address] [phone]        Kinsinger, Arta Bruce, MD Follow up.   Specialty:  General Surgery Contact information: [place] [address] [phone]          Discharge Diagnoses:  Active Problems:   Morbid obesity [facility]) Surgical Procedure: Laparoscopic Sleeve Gastrectomy, upper endoscopy Discharge Condition: Good Disposition: Home Diet recommendation: Postoperative sleeve gastrectomy diet (liquids only)      Filed Weights   07/30/17 0953 07/31/17 1705  Weight: 118.6 kg (261 lb 8 oz) 119.6 kg (263 lb 11.2 oz)    Hospital Course:  The patient was admitted after undergoing laparoscopic sleeve gastrectomy. POD 0 she ambulated well. POD 1 she was started on the water diet protocol and tolerated 300 ml in the first shift. Once meeting the water amount she was advanced to bariatric protein shakes which they tolerated and were discharged home POD 2.  Treatments: surgery: laparoscopic sleeve gastrectomy  Discharge Instructions     Discharge Instructions    Ambulate hourly while awake   Complete by:  As directed    Call MD for:  difficulty breathing, headache or visual disturbances   Complete by:  As directed    Call MD for:  persistant dizziness or light-headedness   Complete by:  As directed    Call MD for:  persistant nausea and vomiting   Complete by:  As directed    Call MD for:  redness, tenderness, or signs of infection (pain, swelling, redness, odor or green/yellow discharge around incision site)   Complete by:  As directed    Call MD for:  severe uncontrolled pain   Complete by:  As directed    Call MD for:  temperature >101 F   Complete by:  As directed    Diet bariatric full liquid   Complete by:  As directed    Discharge wound care:   Complete by:  As directed    Remove Bandaids tomorrow, ok to shower tomorrow. Steristrips may fall off in 1-3 weeks.   Incentive spirometry   Complete by:  As  directed    Perform hourly while awake     Allergies as of 08/01/2017   No Known Allergies                          Medication List               STOP taking these medications            ibuprofen 200 MG tablet Commonly known as:  ADVIL,MOTRIN                       TAKE these medications            CALCIUM 500 + D PO Take 1 tablet by mouth 2 (two) times daily.    multivitamin with minerals Tabs tablet Take 1 tablet by mouth daily.    oxyCODONE 5 MG/5ML solution Commonly known as:  ROXICODONE Take 5 mLs (5 mg total) by mouth every 6 (six)  hours as needed for up to 40 doses for severe pain.                                         Discharge Care Instructions  (From admission, onward)               Start     Ordered   08/01/17 0000  Discharge wound care:    Comments:  Remove Bandaids tomorrow, ok to shower tomorrow. Steristrips may fall off in 1-3 weeks.   08/01/17 1337        Follow-up Information    Kinsinger, Arta Bruce, MD. Go on 08/23/2017.   Specialty:  General Surgery Why:  at 9:15am Contact information: [place] [address] [phone]        Kinsinger, Arta Bruce, MD Follow up.   Specialty:  General Surgery Contact information: [place] [address] [phone]          The results of significant diagnostics from this hospitalization (including imaging, microbiology, ancillary and laboratory) are listed below for reference.    Significant Diagnostic Studies: ImagingResults  No results found.   Labs: Basic Metabolic Panel: LastLabs  Recent Labs  Lab 08/10/17 1630  NA 142  K 4.0  CL 98  CO2 24  GLUCOSE 80  BUN 11  CREATININE 0.86  CALCIUM 9.2     Liver Function Tests: LastLabs  Recent Labs  Lab 08/10/17 1630  AST 14  ALT 10  ALKPHOS 77  BILITOT 0.4  PROT 7.3  ALBUMIN 3.9     CBC: LastLabs     Recent Labs  Lab 08/10/17 1639  WBC 9.8  HGB 10.4*  HCT 32.2*  MCV 81.8     CBG: LastLabs  No results for input(s): GLUCAP in the last 168 hours.   Active Problems:   Morbid obesity ([place])  BP Readings from Last 3 Encounters:  08/10/17 110/70  08/01/17 101/74  07/29/17 129/84   Wt Readings from Last 3 Encounters:  08/15/17 242 lb 9.6 oz (110 kg)  08/10/17 244 lb (110.7 kg)  07/31/17 263 lb 11.2 oz (119.6 kg)   Immunization History  Administered Date(s) Administered  . Tdap 08/08/2016    Review of Systems  Constitutional: Positive for fatigue. Negative for  chills, diaphoresis and fever.  Eyes: Negative for visual  disturbance.  Respiratory: Negative for cough and shortness of breath.   Cardiovascular: Negative for chest pain, palpitations and leg swelling.  Gastrointestinal: Negative for abdominal distention, abdominal pain, anal bleeding, blood in stool, constipation, diarrhea, nausea and vomiting.  Endocrine: Negative for cold intolerance, heat intolerance, polydipsia, polyphagia and polyuria.  Neurological: Positive for dizziness. Negative for tremors, seizures, syncope, facial asymmetry, speech difficulty, weakness, light-headedness, numbness and headaches.    Past Medical History:  Diagnosis Date  . Anemia    Sickle Cell Trait  . Arthritis    BILATERAL KNEES; DDD lumbar spine  . DDD (degenerative disc disease), lumbar   . GERD (gastroesophageal reflux disease)   . Pre-diabetes    Past Surgical History:  Procedure Laterality Date  . BREAST SURGERY     breast reduction  . CESAREAN SECTION    . LAPAROSCOPIC GASTRIC SLEEVE RESECTION N/A 07/30/2017   Procedure: LAPAROSCOPIC GASTRIC SLEEVE RESECTION, UPPER ENDO;  Surgeon: Kinsinger, Arta Bruce, MD;  Location: [facility];  Service: General;  Laterality: N/A;  . REDUCTION MAMMAPLASTY Bilateral 2004  . TUBAL LIGATION     No Known Allergies Current Outpatient Medications on File Prior to Visit  Medication Sig Dispense Refill  . Calcium Carbonate-Vitamin D (CALCIUM 500 + D PO) Take 1 tablet by mouth 2 (two) times daily.    . Multiple Vitamin (MULTIVITAMIN WITH MINERALS) TABS tablet Take 1 tablet by mouth daily.    Marland Kitchen oxyCODONE (ROXICODONE) 5 MG/5ML solution Take 5 mLs (5 mg total) by mouth every 6 (six) hours as needed for up to 40 doses for severe pain. 100 mL 0   No current facility-administered medications on file prior to visit.    Social History   Socioeconomic History  . Marital status: Married    Spouse name: Not on file  . Number of children: 2  . Years of education: Not on file   . Highest education level: Not on file  Social Needs  . Financial resource strain: Not on file  . Food insecurity - worry: Never true  . Food insecurity - inability: Never true  . Transportation needs - medical: Not on file  . Transportation needs - non-medical: Not on file  Occupational History  . 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 Smoking status: Never Smoker  . Smokeless tobacco: Never Used  Substance and Sexual Activity  . Alcohol use: Yes    Alcohol/week: 1.2 - 1.8 oz    Types: 2 - 3 Standard drinks or equivalent per week    Comment: 2/month  . Drug use: No  . Sexual activity: Yes  Other Topics Concern  . 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      Tobacco: none      Alcohol: wine weekends      Exercise: walking; cannot get on elliptical or treadmill; three days per week for 30 minutes      Seatbelt: 100%; no texting.   Family History  Problem Relation Age of Onset  . Heart disease Maternal Grandmother   . Heart attack Maternal Grandmother   . Heart disease Maternal Grandfather   . Heart attack Maternal  Grandfather   . Heart disease Paternal Grandmother   . Heart attack Paternal Grandmother   . Arthritis Father        Objective:    BP 110/70   Pulse 100   Temp 98.2 F (36.8 C) (Oral)   Resp 16   Ht 5\' 4"  (1.626 m)   Wt 244 lb (110.7 kg)   LMP 07/19/2017   SpO2 100%   BMI 41.88 kg/m  Physical Exam  Constitutional: She is oriented to person, place, and time. She appears well-developed and well-nourished. No distress.  HENT:  Head: Normocephalic and atraumatic.  Right Ear: External ear normal.  Left Ear: External ear normal.  Nose: Nose normal.   Mouth/Throat: Oropharynx is clear and moist.  Eyes: Conjunctivae and EOM are normal. Pupils are equal, round, and reactive to light.  Neck: Normal range of motion. Neck supple. Carotid bruit is not present. No thyromegaly present.  Cardiovascular: Normal rate, regular rhythm, normal heart sounds and intact distal pulses. Exam reveals no gallop and no friction rub.  No murmur heard. Pulmonary/Chest: Effort normal and breath sounds normal. She has no wheezes. She has no rales.  Abdominal: Soft. Bowel sounds are normal. She exhibits no distension and no mass. There is no tenderness. There is no rebound and no guarding.  Well healed incisions abdomen.  Lymphadenopathy:    She has no cervical adenopathy.  Neurological: She is alert and oriented to person, place, and time. No cranial nerve deficit.  Skin: Skin is warm and dry. Rash noted. She is not diaphoretic. No erythema. No pallor.  Erythematous rash lower abdomen.   Psychiatric: She has a normal mood and affect. Her behavior is normal.   No results found. Depression screen [facility] 2/9 08/10/2017 07/25/2017 07/18/2017 03/08/2017 01/29/2017  Decreased Interest 0 0 0 0 0  Down, Depressed, Hopeless 0 0 0 0 0  PHQ - 2 Score 0 0 0 0 0   Fall Risk  08/10/2017 07/25/2017 07/18/2017 03/08/2017 01/29/2017  Falls in the past year? No No No No Yes  Number falls in past yr: - - - - 1  Injury with Fall? - - - - No   No data found. Results for orders placed or performed in visit on 08/10/17  Comprehensive metabolic panel  Result Value Ref Range   Glucose 80 65 - 99 mg/dL   BUN 11 6 - 24 mg/dL   Creatinine, Ser 0.86 0.57 - 1.00 mg/dL   GFR calc non Af Amer 81 >59 mL/min/1.73   GFR calc Af Amer 93 >59 mL/min/1.73   BUN/Creatinine Ratio 13 9 - 23   Sodium 142 134 - 144 mmol/L   Potassium 4.0 3.5 - 5.2 mmol/L   Chloride 98 96 - 106 mmol/L   CO2 24 20 - 29 mmol/L   Calcium 9.2 8.7 - 10.2 mg/dL   Total Protein 7.3 6.0 - 8.5 g/dL   Albumin 3.9 3.5 - 5.5 g/dL    Globulin, Total 3.4 1.5 - 4.5 g/dL   Albumin/Globulin Ratio 1.1 (L) 1.2 - 2.2   Bilirubin Total 0.4 0.0 - 1.2 mg/dL   Alkaline Phosphatase 77 39 - 117 IU/L   AST 14 0 - 40 IU/L   ALT 10 0 - 32 IU/L  Iron  Result Value Ref Range   Iron 24 (L) 27 - 159 ug/dL  POCT CBC  Result Value Ref Range   WBC 9.8 4.6 - 10.2 K/uL   Lymph, poc 2.4 0.6 - 3.4  POC LYMPH PERCENT 24.2 10 - 50 %L   MID (cbc) 0.5 0 - 0.9   POC MID % 5.0 0 - 12 %M   POC Granulocyte 6.9 2 - 6.9   Granulocyte percent 70.8 37 - 80 %G   RBC 3.93 (A) 4.04 - 5.48 M/uL   Hemoglobin 10.4 (A) 12.2 - 16.2 g/dL   HCT, POC 32.2 (A) 37.7 - 47.9 %   MCV 81.8 80 - 97 fL   MCH, POC 26.5 (A) 27 - 31.2 pg   MCHC 32.4 31.8 - 35.4 g/dL   RDW, POC 14.5 %   Platelet Count, POC 515 (A) 142 - 424 K/uL   MPV 7.9 0 - 99.8 fL  POCT glucose (manual entry)  Result Value Ref Range   POC Glucose 82 70 - 99 mg/dl  POCT urinalysis dipstick  Result Value Ref Range   Color, UA straw (A) yellow   Clarity, UA clear clear   Glucose, UA negative negative mg/dL   Bilirubin, UA moderate (A) negative   Ketones, POC UA >= (160) (A) negative mg/dL   Spec Grav, UA 1.025 1.010 - 1.025   Blood, UA moderate (A) negative   pH, UA 5.5 5.0 - 8.0   Protein Ur, POC =30 (A) negative mg/dL   Urobilinogen, UA 0.2 0.2 or 1.0 E.U./dL   Nitrite, UA Negative Negative   Leukocytes, UA Small (1+) (A) Negative        Assessment & Plan:   1. Other fatigue   2. S/P laparoscopic sleeve gastrectomy   3. Other iron deficiency anemia   4. Irritant contact dermatitis due to drug in contact with skin   5. Morbid obesity (HCC) Chronic  6. Slow transit constipation    Status post sleeve gastrectomy; doing well on postop.  Without fever with good p.o. intake.  Suffering with severe fatigue post-operatively; orthostatics stable; hemoglobin improving. Obtain electrolytes.  Expect fatigue to improve in upcoming 1-2 weeks.   Recommend close follow-up nutrition and general  surgery as recommended.  New onset contact dermatitis most consistent with surgical tape versus cleansing foam prior to surgery.  Prescription for triamcinolone cream provided.  Iron deficiency anemia present: Improvement since hospital discharge.  Prescription for iron supplement provided.  Colace 100 mg 1 tablet twice daily to replace MiraLAX for constipation and postoperative period.  Orders Placed This Encounter  Procedures  . Comprehensive metabolic panel  . Iron  . Orthostatic vital signs  . POCT CBC  . POCT glucose (manual entry)  . POCT urinalysis dipstick   Meds ordered this encounter  Medications  . Iron-Folic Acid-Vit L46 (IRON FORMULA) 27-400-100 MG-MCG-MCG CAPS    Sig: Take 1 capsule by mouth daily.    Dispense:  90 capsule    Refill:  1  . triamcinolone cream (KENALOG) 0.1 %    Sig: Apply 1 application topically 2 (two) times daily.    Dispense:  30 g    Refill:  0    No Follow-up on file.   Alexee Delsanto Elayne Guerin, M.D. Primary Care at [facility] previously [facility] [place] [address] [place], [place]  [address] [phone] phone [phone] fax

## 2017-08-10 NOTE — Patient Instructions (Addendum)
   Colace one twice daily.  Generic.  To replace Miralax.   IF you received an x-ray today, you will receive an invoice from [facility]. Please contact [facility] at [phone] with questions or concerns regarding your invoice.   IF you received labwork today, you will receive an invoice from [facility]. Please contact [facility] at [phone] with questions or concerns regarding your invoice.   Our billing staff will not be able to assist you with questions regarding bills from these companies.  You will be contacted with the lab results as soon as they are available. The fastest way to get your results is to activate your My Chart account. Instructions are located on the last page of this paperwork. If you have not heard from [place] regarding the results in 2 weeks, please contact this office.

## 2017-08-11 LAB — COMPREHENSIVE METABOLIC PANEL
ALBUMIN: 3.9 g/dL (ref 3.5–5.5)
ALK PHOS: 77 IU/L (ref 39–117)
ALT: 10 IU/L (ref 0–32)
AST: 14 IU/L (ref 0–40)
Albumin/Globulin Ratio: 1.1 — ABNORMAL LOW (ref 1.2–2.2)
BILIRUBIN TOTAL: 0.4 mg/dL (ref 0.0–1.2)
BUN / CREAT RATIO: 13 (ref 9–23)
BUN: 11 mg/dL (ref 6–24)
CHLORIDE: 98 mmol/L (ref 96–106)
CO2: 24 mmol/L (ref 20–29)
Calcium: 9.2 mg/dL (ref 8.7–10.2)
Creatinine, Ser: 0.86 mg/dL (ref 0.57–1.00)
GFR calc Af Amer: 93 mL/min/{1.73_m2} (ref >59)
GFR calc non Af Amer: 81 mL/min/{1.73_m2} (ref >59)
Globulin, Total: 3.4 g/dL (ref 1.5–4.5)
Glucose: 80 mg/dL (ref 65–99)
Potassium: 4 mmol/L (ref 3.5–5.2)
Sodium: 142 mmol/L (ref 134–144)
Total Protein: 7.3 g/dL (ref 6.0–8.5)

## 2017-08-11 LAB — IRON: IRON: 24 ug/dL — AB (ref 27–159)

## 2017-08-13 ENCOUNTER — Encounter: Payer: 59 | Attending: General Surgery | Admitting: Registered"

## 2017-08-13 DIAGNOSIS — Z6841 Body Mass Index (BMI) 40.0 and over, adult: Secondary | ICD-10-CM | POA: Insufficient documentation

## 2017-08-13 DIAGNOSIS — E669 Obesity, unspecified: Secondary | ICD-10-CM | POA: Diagnosis not present

## 2017-08-13 DIAGNOSIS — Z713 Dietary counseling and surveillance: Secondary | ICD-10-CM | POA: Insufficient documentation

## 2017-08-15 NOTE — Progress Notes (Signed)
Bariatric Class:  Appt start time: 1530 end time:  1630.  2 Week Post-Operative Nutrition Class  Patient was seen on 08/13/2017 for Post[facility]ative [facility].   Surgery date: 07/30/2017 Surgery type: Sleeve  Start weight at [facility]: 264.6 Weight today: 242.6 Weight change: 22 lbs loss  Pt reports headaches, dizziness, lightheadedness sometimes. Pt reports having trouble drinking about 48 ounces of fluid a day and 30 grams of protein.   TANITA  BODY COMP RESULTS  08/13/2017   BMI (kg/m^2) 41.6   Fat Mass (lbs) 125.0   Fat Free Mass (lbs) 117.6   Total Body Water (lbs) 86.2   The following the learning objectives were met by the patient during this course:  Identifies Phase 3A (Soft, High Proteins) Dietary Goals and will begin from 2 weeks post-operatively to 2 months post-operatively  Identifies appropriate sources of fluids and proteins   States protein recommendations and appropriate sources post-operatively  Identifies the need for appropriate texture modifications, mastication, and bite sizes when consuming solids  Identifies appropriate multivitamin and calcium sources post-operatively  Describes the need for physical activity post-operatively and will follow MD recommendations  States when to call healthcare provider regarding medication questions or post-operative complications  Handouts given during class include:  Phase 3A: Soft, High Protein Diet Handout  Follow-Up Plan: Patient will follow-up at [facility] in 6 weeks for 2 month post-op nutrition visit for diet advancement per MD.    

## 2017-08-15 NOTE — Discharge Summary (Signed)
Physician Discharge Summary  Sandra Jenkins VHQ:[id number] DOB: 1970/07/18 DOA: 07/30/2017  PCP: Wardell Honour, MD  Admit date: 07/30/2017 Discharge date: 08/15/2017  Recommendations for Outpatient Follow-up:  1.  (include homehealth, outpatient follow-up instructions, specific recommendations for PCP to follow-up on, etc.)  Follow-up Information    Korynne Dols, Arta Bruce, MD. Go on 08/23/2017.   Specialty:  General Surgery Why:  at 9:15am Contact information: [place] [address] [phone]        Rockne Dearinger, Arta Bruce, MD Follow up.   Specialty:  General Surgery Contact information: [place]  [address] [phone]          Discharge Diagnoses:  Active Problems:   Morbid obesity [facility])   Surgical Procedure: Laparoscopic Sleeve Gastrectomy, upper endoscopy  Discharge Condition: Good Disposition: Home  Diet recommendation: Postoperative sleeve gastrectomy diet (liquids only)  Filed Weights   07/30/17 0953 07/31/17 1705  Weight: 118.6 kg (261 lb 8 oz) 119.6 kg (263 lb 11.2 oz)     Hospital Course:  The patient was admitted after undergoing laparoscopic sleeve gastrectomy. POD 0 she ambulated well. POD 1 she was started on the water diet protocol and tolerated 300 ml in the first shift. Once meeting the water amount she was advanced to bariatric protein shakes which they tolerated and were discharged home POD 2.  Treatments: surgery: laparoscopic sleeve gastrectomy  Discharge Instructions  Discharge Instructions    Ambulate hourly while awake   Complete by:  As directed    Call MD for:  difficulty breathing, headache or visual disturbances   Complete by:  As directed    Call MD for:  persistant dizziness or light-headedness   Complete by:  As directed    Call MD for:  persistant nausea and vomiting   Complete by:  As directed    Call MD for:  redness, tenderness, or signs of infection (pain,  swelling, redness, odor or green/yellow discharge around incision site)   Complete by:  As directed    Call MD for:  severe uncontrolled pain   Complete by:  As directed    Call MD for:  temperature >101 F   Complete by:  As directed    Diet bariatric full liquid   Complete by:  As directed    Discharge wound care:   Complete by:  As directed    Remove Bandaids tomorrow, ok to shower tomorrow. Steristrips may fall off in 1-3 weeks.   Incentive spirometry   Complete by:  As directed    Perform hourly while awake     Allergies as of 08/01/2017   No Known Allergies     Medication List    STOP taking these medications   ibuprofen 200 MG tablet Commonly known as:  ADVIL,MOTRIN     TAKE these medications   CALCIUM 500 + D PO Take 1 tablet by mouth 2 (two) times daily.   multivitamin with minerals Tabs tablet Take 1 tablet by mouth daily.   oxyCODONE 5 MG/5ML solution Commonly known as:  ROXICODONE Take 5 mLs (5 mg total) by mouth every 6 (six) hours as needed for up to 40 doses for severe pain.            Discharge Care Instructions  (From admission, onward)        Start     Ordered   08/01/17 0000  Discharge wound care:    Comments:  Remove Bandaids tomorrow,  ok to shower tomorrow. Steristrips may fall off in 1-3 weeks.   08/01/17 1337     Follow-up Information    Pryce Folts, Arta Bruce, MD. Go on 08/23/2017.   Specialty:  General Surgery Why:  at 9:15am Contact information: [place] [address] [phone]        Estoria Geary, Arta Bruce, MD Follow up.   Specialty:  General Surgery Contact information: [place] [address] [phone]            The results of significant diagnostics from this hospitalization (including imaging, microbiology, ancillary and laboratory) are listed below for reference.    Significant Diagnostic Studies: No results found.  Labs: Basic Metabolic Panel: Recent  Labs  Lab 08/10/17 1630  NA 142  K 4.0  CL 98  CO2 24  GLUCOSE 80  BUN 11  CREATININE 0.86  CALCIUM 9.2   Liver Function Tests: Recent Labs  Lab 08/10/17 1630  AST 14  ALT 10  ALKPHOS 77  BILITOT 0.4  PROT 7.3  ALBUMIN 3.9    CBC: Recent Labs  Lab 08/10/17 1639  WBC 9.8  HGB 10.4*  HCT 32.2*  MCV 81.8    CBG: No results for input(s): GLUCAP in the last 168 hours.  Active Problems:   Morbid obesity ([place])   Time coordinating discharge: 47min

## 2017-08-27 ENCOUNTER — Telehealth: Payer: Self-pay | Admitting: Family Medicine

## 2017-08-27 NOTE — Telephone Encounter (Signed)
Copied from [place]. Topic: Quick Communication - Lab Results >> Aug 27, 2017 12:36 PM Darl Householder, RMA wrote: Patient is  requesting lab results, please return call

## 2017-08-30 NOTE — Telephone Encounter (Signed)
Provider, please advise.  

## 2017-09-08 NOTE — Telephone Encounter (Signed)
Call --- iron level low; continue to take iron supplement as prescribed at visit. Hemoglobin had improved to 10.4 as discussed at visit.  Liver and kidney functions are normal.  Sodium and potassium levels are normal.  Urine was concentrated (as discussed at visit); increase water intake.  How is patient feeling?

## 2017-09-10 NOTE — Telephone Encounter (Signed)
Phone call to patient, relayed message from Dr. Tamala Julian. Patient agreeable, verbalizes understanding. She states she is doing okay - having trouble with FMLA process at her surgeon's office. Patient will call to make appointment if anything else is needed.   Provider, Juluis Rainier.

## 2017-09-11 ENCOUNTER — Telehealth: Payer: Self-pay | Admitting: Skilled Nursing Facility1

## 2017-09-11 NOTE — Telephone Encounter (Signed)
Pt called in hysterics stating she cannot come in for any appointments with the dietitian or the surgeon because Dr. Kieth Brightly will not sign her FMLA form. Pt states she talked with the office manager with [employer] and was told Dr. Kieth Brightly will not sign the FMLA forms and she would have to work it out with her work. Pt states she will just have to sue [employer] when she falls out or dies because she will not be able to come for any post-op appointments. Pt states she should have never gotten surgery through [employer]. Pt states she cannot come to any follow up apportionments because she cannot take any days off of work. Pt states she works from 9am-5:30pm Mon-Fri.    Dietitian offered 7:30am appointments and if she could get off a few minutes early to be here by 5:45pm. Pt responded stating she has to take her daughter to school so she could never do a 7:30am appointment. Dietitian stated if her daughter takes the bus or finds another ride the 7:30am appointments could be an option. Pt replied thank you cancel my appointment for March and hung up.

## 2017-09-16 ENCOUNTER — Telehealth: Payer: Self-pay | Admitting: Skilled Nursing Facility1

## 2017-09-16 NOTE — Telephone Encounter (Signed)
Returned phone call.  Left a voicemail.

## 2017-09-16 NOTE — Telephone Encounter (Signed)
Pt states she Passed out at [place]. Having had protein shake and water before going.   "yall don't accommodate people who cannot get off work." "This is ridiculous, [employer] needs to sign my FMLA"  "I knew I should not have had surgery with [employer]."  Dietitian offered [facility] supervisor but the pt said her issues are not with [employer] they are with [employer].   Pt states her husband does not work wendsdays so she will have him take her daughter to school so she made an appointment for April 10th at 7:30.

## 2017-09-21 ENCOUNTER — Encounter: Payer: Self-pay | Admitting: Family Medicine

## 2017-09-21 ENCOUNTER — Ambulatory Visit (INDEPENDENT_AMBULATORY_CARE_PROVIDER_SITE_OTHER): Payer: 59 | Admitting: Family Medicine

## 2017-09-21 ENCOUNTER — Ambulatory Visit (INDEPENDENT_AMBULATORY_CARE_PROVIDER_SITE_OTHER): Payer: 59

## 2017-09-21 ENCOUNTER — Other Ambulatory Visit: Payer: Self-pay

## 2017-09-21 VITALS — BP 110/74 | HR 82 | Temp 99.1°F | Resp 18 | Ht 64.33 in | Wt 231.6 lb

## 2017-09-21 DIAGNOSIS — R55 Syncope and collapse: Secondary | ICD-10-CM

## 2017-09-21 DIAGNOSIS — M545 Low back pain, unspecified: Secondary | ICD-10-CM

## 2017-09-21 DIAGNOSIS — D573 Sickle-cell trait: Secondary | ICD-10-CM

## 2017-09-21 DIAGNOSIS — M25512 Pain in left shoulder: Secondary | ICD-10-CM

## 2017-09-21 DIAGNOSIS — M542 Cervicalgia: Secondary | ICD-10-CM

## 2017-09-21 DIAGNOSIS — G44319 Acute post-traumatic headache, not intractable: Secondary | ICD-10-CM | POA: Diagnosis not present

## 2017-09-21 DIAGNOSIS — Z9884 Bariatric surgery status: Secondary | ICD-10-CM

## 2017-09-21 LAB — POCT CBC
Granulocyte percent: 51.1 %G (ref 37–80)
HCT, POC: 35.1 % — AB (ref 37.7–47.9)
Hemoglobin: 11.2 g/dL — AB (ref 12.2–16.2)
Lymph, poc: 2.4 (ref 0.6–3.4)
MCH: 26 pg — AB (ref 27–31.2)
MCHC: 31.9 g/dL (ref 31.8–35.4)
MCV: 81.4 fL (ref 80–97)
MID (CBC): 0.3 (ref 0–0.9)
MPV: 9.2 fL (ref 0–99.8)
PLATELET COUNT, POC: 288 10*3/uL (ref 142–424)
POC Granulocyte: 2.8 (ref 2–6.9)
POC LYMPH PERCENT: 44 %L (ref 10–50)
POC MID %: 4.9 %M (ref 0–12)
RBC: 4.31 M/uL (ref 4.04–5.48)
RDW, POC: 16.8 %
WBC: 5.5 10*3/uL (ref 4.6–10.2)

## 2017-09-21 LAB — POCT URINALYSIS DIP (MANUAL ENTRY)
GLUCOSE UA: NEGATIVE mg/dL
Nitrite, UA: NEGATIVE
PH UA: 5.5 (ref 5.0–8.0)
Protein Ur, POC: NEGATIVE mg/dL
RBC UA: NEGATIVE
SPEC GRAV UA: 1.015 (ref 1.010–1.025)
Urobilinogen, UA: 1 E.U./dL

## 2017-09-21 LAB — GLUCOSE, POCT (MANUAL RESULT ENTRY): POC Glucose: 91 mg/dl (ref 70–99)

## 2017-09-21 MED ORDER — CYCLOBENZAPRINE HCL 5 MG PO TABS: 5.0000 mg | ORAL_TABLET | Freq: Three times a day (TID) | ORAL | 0 refills | Status: DC | PRN

## 2017-09-21 NOTE — Patient Instructions (Addendum)
   IF you received an x-ray today, you will receive an invoice from [facility]. Please contact [facility] at [phone] with questions or concerns regarding your invoice.   IF you received labwork today, you will receive an invoice from [facility]. Please contact [facility] at [phone] with questions or concerns regarding your invoice.   Our billing staff will not be able to assist you with questions regarding bills from these companies.  You will be contacted with the lab results as soon as they are available. The fastest way to get your results is to activate your My Chart account. Instructions are located on the last page of this paperwork. If you have not heard from us regarding the results in 2 weeks, please contact this office.     Concussion, Adult A concussion is a brain injury from a direct hit (blow) to the head or body. This blow causes the brain to shake quickly back and forth inside the skull. This can damage brain cells and cause chemical changes in the brain. A concussion may also be known as a mild traumatic brain injury (TBI). Concussions are usually not life-threatening, but the effects of a concussion can be serious. If you have a concussion, you are more likely to experience concussion-like symptoms after a direct blow to the head in the future. What are the causes? This condition is caused by:  A direct blow to the head, such as from running into another player during a game, being hit in a fight, or hitting your head on a hard surface.  A jolt of the head or neck that causes the brain to move back and forth inside the skull, such as in a car crash.  What are the signs or symptoms? The signs of a concussion can be hard to notice. Early on, they may be missed by you, family members, and health care providers. You may look fine but act or feel differently. Symptoms are usually temporary, but they may last for days, weeks, or even longer. Some  symptoms may appear right away but other symptoms may not show up for hours or days. Every head injury is different. Symptoms may include:  Headaches. This can include a feeling of pressure in the head.  Memory problems.  Trouble concentrating, organizing, or making decisions.  Slowness in thinking, acting or reacting, speaking, or reading.  Confusion.  Fatigue.  Changes in eating or sleeping patterns.  Problems with coordination or balance.  Nausea or vomiting.  Numbness or tingling.  Sensitivity to light or noise.  Vision or hearing problems.  Reduced sense of smell.  Irritability or mood changes.  Dizziness.  Lack of motivation.  Seeing or hearing things that other people do not see or hear (hallucinations).  How is this diagnosed? This condition is diagnosed based on:  Your symptoms.  A description of your injury.  You may also have tests, including:  Imaging tests, such as a CT scan or MRI. These are done to look for signs of brain injury.  Neuropsychological tests. These measure your thinking, understanding, learning, and remembering abilities.  How is this treated? This condition is treated with physical and mental rest and careful observation, usually at home. If the concussion is severe, you may need to stay home from work for a while. You may be referred to a concussion clinic or to other health care providers for management. It is important that you tell your health care provider if:  You are taking any medicines,   including prescription medicines, over-the-counter medicines, and natural remedies. Some medicines, such as blood thinners (anticoagulants) and aspirin, may increase the chance of complications, such as bleeding.  You are taking or have taken alcohol or illegal drugs. Alcohol and certain other drugs may slow your recovery and can put you at risk of further injury.  How fast you will recover from a concussion depends on many factors, such as  how severe your concussion is, what part of your brain was injured, how old you are, and how healthy you were before the concussion. Recovery can take time. It is important to wait to return to activity until a health care provider says it is safe to do that and your symptoms are completely gone. Follow these instructions at home: Activity  Limit activities that require a lot of thought or concentration. These may include: ? Doing homework or job-related work. ? Watching TV. ? Working on the computer. ? Playing memory games and puzzles.  Rest. Rest helps the brain to heal. Make sure you: ? Get plenty of sleep at night. Avoid staying up late at night. ? Keep the same bedtime hours on weekends and weekdays. ? Rest during the day. Take naps or rest breaks when you feel tired.  Having another concussion before the first one has healed can be dangerous. Do not do high-risk activities that could cause a second concussion, such as riding a bicycle or playing sports.  Ask your health care provider when you can return to your normal activities, such as school, work, athletics, driving, riding a bicycle, or using heavy machinery. Your ability to react may be slower after a brain injury. Never do these activities if you are dizzy. Your health care provider will likely give you a plan for gradually returning to activities. General instructions  Take over-the-counter and prescription medicines only as told by your health care provider.  Do not drink alcohol until your health care provider says you can.  If it is harder than usual to remember things, write them down.  If you are easily distracted, try to do one thing at a time. For example, do not try to watch TV while fixing dinner.  Talk with family members or close friends when making important decisions.  Watch your symptoms and tell others to do the same. Complications sometimes occur after a concussion. Older adults with a brain injury may have  a higher risk of serious complications, such as a blood clot in the brain.  Tell your teachers, school nurse, school counselor, coach, athletic trainer, or work manager about your injury, symptoms, and restrictions. Tell them about what you can or cannot do. They should watch for: ? Increased problems with attention or concentration. ? Increased difficulty remembering or learning new information. ? Increased time needed to complete tasks or assignments. ? Increased irritability or decreased ability to cope with stress. ? Increased symptoms.  Keep all follow-up visits as told by your health care provider. This is important. How is this prevented? It is very important to avoid another brain injury, especially as you recover. In rare cases, another injury can lead to permanent brain damage, brain swelling, or death. The risk of this is greatest during the first 7-10 days after a head injury. Avoid injuries by:  Wearing a seat belt when riding in a car.  Wearing a helmet when biking, skiing, skateboarding, skating, or doing similar activities.  Avoiding activities that could lead to a second concussion, such as contact or recreational sports,   until your health care provider says it is okay.  Taking safety measures in your home, such as: ? Removing clutter and tripping hazards from floors and stairways. ? Using grab bars in bathrooms and handrails by stairs. ? Placing non-slip mats on floors and in bathtubs. ? Improving lighting in dim areas.  Contact a health care provider if:  Your symptoms get worse.  You have new symptoms.  You continue to have symptoms for more than 2 weeks. Get help right away if:  You have severe or worsening headaches.  You have weakness or numbness in any part of your body.  Your coordination gets worse.  You vomit repeatedly.  You are sleepier.  The pupil of one eye is larger than the other.  You have convulsions or a seizure.  Your speech is  slurred.  Your fatigue, confusion, or irritability gets worse.  You cannot recognize people or places.  You have neck pain.  It is difficult to wake you up.  You have unusual behavior changes.  You lose consciousness. Summary  A concussion is a brain injury from a direct hit (blow) to the head or body.  A concussion may also be called a mild traumatic brain injury (TBI).  You may have imaging tests and neuropsychological tests to diagnose a concussion.  This condition is treated with physical and mental rest and careful observation.  Ask your health care provider when you can return to your normal activities, such as school, work, athletics, driving, riding a bicycle, or using heavy machinery. Follow safety instructions as told by your health care provider. This information is not intended to replace advice given to you by your health care provider. Make sure you discuss any questions you have with your health care provider. Document Released: 09/15/2003 Document Revised: 06/05/2016 Document Reviewed: 06/05/2016 [employer] Interactive Patient Education  2018 [employer]  

## 2017-09-21 NOTE — Progress Notes (Signed)
Subjective:    Patient ID: Sandra Jenkins, female    DOB: 1971/03/19, 47 y.o.   MRN: [id number]  09/21/2017  Fall (X 5 days- lump on left side of head/face hurts, left shoulder, and mid back paim) and Fatigue (f/u)    HPI This 47 y.o. female presents for evaluation of syncopal event.  Five days ago, working out at [employer] with [employer] 6:30am-7:30am at [employer].  Does cardio for thirty minutes; walked around the track.  Then does weightlifting; doing different floor exercises.  Started feeling dizzy while doing squat and throw ball against the wall.  Surgery 08-04-17.  Release to exercise at four weeks.  Passed out when walking to get water.  [facility] program will not clear for exercise until six weeks.  Started right at six weeks.    Had to fight for everything; working on FMLA to avoid losing job.  Had appointment with general surgeon to discuss FMLA to attend follow-up appointments and to attend nutrition appointments.  Refused to complete.  Employer states that must have FMLA or take PAL; when out for short term disability, all PAL taken.  Must wait until April to go to nutritionist.  No Saturday nutrition classes.  Was to go in March 2019 to nutritionist.  Strictly protein diet.    Head trauma: fell on hit concrete.  Still very sore.  Very short LOC.  Daily headaches.  Blurred vision intermittently.  If watches TV in dark, there is blurred vision.  Some dizziness.  No nausea or vomiting.  Some amnesia; does not remember anything past saying I need water. Remembers everything after coming to.  Thoughts are clear now.  Some neck pain and L shoulder pain.  Lower back pain. No radiation.  No n/t/burning.  Normal b/b function.  Some constipation.    Wants to drink natural tea to help with constipation.  Not sure if tea or chili.    Headache 6/10. Neck 3/10. Shoulder L 4-5/10. Lower back 5/10.   BP Readings from Last 3 Encounters:  09/21/17 110/74  08/10/17 110/70  08/01/17 101/74   Wt  Readings from Last 3 Encounters:  09/21/17 231 lb 9.6 oz (105.1 kg)  08/15/17 242 lb 9.6 oz (110 kg)  08/10/17 244 lb (110.7 kg)   Immunization History  Administered Date(s) Administered  . Tdap 08/08/2016    Review of Systems  Constitutional: Positive for fatigue. Negative for activity change, appetite change, chills, diaphoresis, fever and unexpected weight change.  HENT: Negative for congestion, dental problem, drooling, ear discharge, ear pain, facial swelling, hearing loss, mouth sores, nosebleeds, postnasal drip, rhinorrhea, sinus pressure, sneezing, sore throat, tinnitus, trouble swallowing and voice change.   Eyes: Negative for photophobia, pain, discharge, redness, itching and visual disturbance.  Respiratory: Negative for apnea, cough, choking, chest tightness, shortness of breath, wheezing and stridor.   Cardiovascular: Negative for chest pain, palpitations and leg swelling.  Gastrointestinal: Positive for constipation. Negative for abdominal distention, abdominal pain, anal bleeding, blood in stool, diarrhea, nausea, rectal pain and vomiting.  Endocrine: Negative for cold intolerance, heat intolerance, polydipsia, polyphagia and polyuria.  Genitourinary: Negative for decreased urine volume, difficulty urinating, dyspareunia, dysuria, enuresis, flank pain, frequency, genital sores, hematuria, menstrual problem, pelvic pain, urgency, vaginal bleeding, vaginal discharge and vaginal pain.  Musculoskeletal: Positive for arthralgias, back pain, neck pain and neck stiffness. Negative for gait problem, joint swelling and myalgias.  Skin: Negative for color change, pallor, rash and wound.  Allergic/Immunologic: Negative for environmental allergies, food allergies  and immunocompromised state.  Neurological: Positive for headaches. Negative for dizziness, tremors, seizures, syncope, facial asymmetry, speech difficulty, weakness, light-headedness and numbness.  Hematological: Negative for  adenopathy. Does not bruise/bleed easily.  Psychiatric/Behavioral: Negative for agitation, behavioral problems, confusion, decreased concentration, dysphoric mood, hallucinations, self-injury, sleep disturbance and suicidal ideas. The patient is not nervous/anxious and is not hyperactive.     Past Medical History:  Diagnosis Date  . Anemia    Sickle Cell Trait  . Arthritis    BILATERAL KNEES; DDD lumbar spine  . DDD (degenerative disc disease), lumbar   . GERD (gastroesophageal reflux disease)   . Pre-diabetes    Past Surgical History:  Procedure Laterality Date  . BREAST SURGERY     breast reduction  . CESAREAN SECTION    . LAPAROSCOPIC GASTRIC SLEEVE RESECTION N/A 07/30/2017   Procedure: LAPAROSCOPIC GASTRIC SLEEVE RESECTION, UPPER ENDO;  Surgeon: Kinsinger, Arta Bruce, MD;  Location: [facility];  Service: General;  Laterality: N/A;  . REDUCTION MAMMAPLASTY Bilateral 2004  . TUBAL LIGATION     No Known Allergies Current Outpatient Medications on File Prior to Visit  Medication Sig Dispense Refill  . Calcium Carbonate-Vitamin D (CALCIUM 500 + D PO) Take 1 tablet by mouth 2 (two) times daily.    . Iron-Folic Acid-Vit Z36 (IRON FORMULA) 27-400-100 MG-MCG-MCG CAPS Take 1 capsule by mouth daily. (Patient not taking: Reported on 09/21/2017) 90 capsule 1  . Multiple Vitamin (MULTIVITAMIN WITH MINERALS) TABS tablet Take 1 tablet by mouth daily.    Marland Kitchen triamcinolone cream (KENALOG) 0.1 % Apply 1 application topically 2 (two) times daily. (Patient not taking: Reported on 09/21/2017) 30 g 0   No current facility-administered medications on file prior to visit.    Social History   Socioeconomic History  . Marital status: Married    Spouse name: Not on file  . Number of children: 2  . Years of education: Not on file  . Highest education level: Not on file  Occupational History  . 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 Financial  resource strain: Not on file  . Food insecurity:    Worry: Never true    Inability: Never true  . Transportation needs:    Medical: Not on file    Non-medical: Not on file  Tobacco Use  . Smoking status: Never Smoker  . Smokeless tobacco: Never Used  Substance and Sexual Activity  . Alcohol use: Yes    Alcohol/week: 1.2 - 1.8 oz    Types: 2 - 3 Standard drinks or equivalent per week    Comment: 2/month  . Drug use: No  . Sexual activity: Yes  Lifestyle  . Physical activity:    Days per week: Not on file    Minutes per session: Not on file  . Stress: Not on file  Relationships  . Social connections:    Talks on phone: Not on file    Gets together: Not on file    Attends religious service: Not on file    Active member of club or organization: Not on file    Attends meetings of clubs or organizations: Not on file    Relationship status: Not on file  . Intimate partner violence:    Fear of current or ex partner: Not on file    Emotionally abused: Not on file    Physically abused: Not on file    Forced sexual activity: Not on  file  Other Topics Concern  . 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      Tobacco: none      Alcohol: wine weekends      Exercise: walking; cannot get on elliptical or treadmill; three days per week for 30 minutes      Seatbelt: 100%; no texting.   Family History  Problem Relation Age of Onset  . Heart disease Maternal Grandmother   . Heart attack Maternal Grandmother   . Heart disease Maternal Grandfather   . Heart attack Maternal Grandfather   . Heart disease Paternal Grandmother   . Heart attack Paternal Grandmother   . Arthritis Father        Objective:    BP 110/74 (BP Location: Right Arm, Patient  Position: Sitting, Cuff Size: Large)   Pulse 82   Temp 99.1 F (37.3 C) (Oral)   Resp 18   Ht 5' 4.33" (1.634 m)   Wt 231 lb 9.6 oz (105.1 kg)   LMP 09/04/2017 (Approximate)   SpO2 97%   BMI 39.35 kg/m  Physical Exam  Constitutional: She is oriented to person, place, and time. She appears well-developed and well-nourished. No distress.  HENT:  Head: Normocephalic and atraumatic.  Right Ear: Hearing, tympanic membrane, external ear and ear canal normal.  Left Ear: Hearing, tympanic membrane, external ear and ear canal normal.  Nose: Nose normal.  Mouth/Throat: Oropharynx is clear and moist.  Eyes: Conjunctivae and EOM are normal. Pupils are equal, round, and reactive to light.  Neck: Normal range of motion and full passive range of motion without pain. Neck supple. No JVD present. Carotid bruit is not present. No thyromegaly present.  Cardiovascular: Normal rate, regular rhythm, normal heart sounds and intact distal pulses. Exam reveals no gallop and no friction rub.  No murmur heard. Pulmonary/Chest: Effort normal and breath sounds normal. No respiratory distress. She has no wheezes. She has no rales. Right breast exhibits no inverted nipple, no mass, no nipple discharge, no skin change and no tenderness. Left breast exhibits no inverted nipple, no mass, no nipple discharge, no skin change and no tenderness. Breasts are symmetrical.  Abdominal: Soft. Bowel sounds are normal. She exhibits no distension and no mass. There is no tenderness. There is no rebound and no guarding.  Musculoskeletal:       Right shoulder: Normal. She exhibits normal range of motion, no tenderness and no bony tenderness.       Left shoulder: She exhibits tenderness and pain. She exhibits normal range of motion, no bony tenderness, no spasm, normal pulse and normal strength.       Cervical back: She exhibits tenderness, pain and spasm. She exhibits normal range of motion and no bony tenderness.       Lumbar back:  She exhibits tenderness and pain. She exhibits normal range of motion, no bony tenderness, no spasm and normal pulse.  Lymphadenopathy:    She has no cervical adenopathy.  Neurological: She is alert and oriented to person, place, and time. She has normal reflexes. No cranial nerve deficit. She exhibits normal muscle tone. Coordination normal.  Skin: Skin is warm and dry. No rash noted. She is not diaphoretic. No erythema.  No pallor.  Psychiatric: She has a normal mood and affect. Her behavior is normal. Judgment and thought content normal.  Nursing note and vitals reviewed.  No results found. Depression screen [facility] 2/9 09/21/2017 08/10/2017 07/25/2017 07/18/2017 03/08/2017  Decreased Interest 0 0 0 0 0  Down, Depressed, Hopeless 0 0 0 0 0  PHQ - 2 Score 0 0 0 0 0   Fall Risk  09/21/2017 08/10/2017 07/25/2017 07/18/2017 03/08/2017  Falls in the past year? Yes No No No No  Number falls in past yr: 1 - - - -  Injury with Fall? Yes - - - -    Results for orders placed or performed in visit on 09/21/17  Comprehensive metabolic panel  Result Value Ref Range   Glucose 83 65 - 99 mg/dL   BUN 8 6 - 24 mg/dL   Creatinine, Ser 0.69 0.57 - 1.00 mg/dL   GFR calc non Af Amer 104 >59 mL/min/1.73   GFR calc Af Amer 120 >59 mL/min/1.73   BUN/Creatinine Ratio 12 9 - 23   Sodium 144 134 - 144 mmol/L   Potassium 3.9 3.5 - 5.2 mmol/L   Chloride 103 96 - 106 mmol/L   CO2 26 20 - 29 mmol/L   Calcium 9.2 8.7 - 10.2 mg/dL   Total Protein 6.5 6.0 - 8.5 g/dL   Albumin 3.4 (L) 3.5 - 5.5 g/dL   Globulin, Total 3.1 1.5 - 4.5 g/dL   Albumin/Globulin Ratio 1.1 (L) 1.2 - 2.2   Bilirubin Total 0.2 0.0 - 1.2 mg/dL   Alkaline Phosphatase 79 39 - 117 IU/L   AST 11 0 - 40 IU/L   ALT 8 0 - 32 IU/L  POCT CBC  Result Value Ref Range   WBC 5.5 4.6 - 10.2 K/uL   Lymph, poc 2.4 0.6 - 3.4   POC LYMPH PERCENT 44.0 10 - 50 %L   MID (cbc) 0.3 0 - 0.9   POC MID % 4.9 0 - 12 %M   POC Granulocyte 2.8 2 - 6.9   Granulocyte percent  51.1 37 - 80 %G   RBC 4.31 4.04 - 5.48 M/uL   Hemoglobin 11.2 (A) 12.2 - 16.2 g/dL   HCT, POC 35.1 (A) 37.7 - 47.9 %   MCV 81.4 80 - 97 fL   MCH, POC 26.0 (A) 27 - 31.2 pg   MCHC 31.9 31.8 - 35.4 g/dL   RDW, POC 16.8 %   Platelet Count, POC 288 142 - 424 K/uL   MPV 9.2 0 - 99.8 fL  POCT glucose (manual entry)  Result Value Ref Range   POC Glucose 91 70 - 99 mg/dl  POCT urinalysis dipstick  Result Value Ref Range   Color, UA yellow yellow   Clarity, UA cloudy (A) clear   Glucose, UA negative negative mg/dL   Bilirubin, UA small (A) negative   Ketones, POC UA large (80) (A) negative mg/dL   Spec Grav, UA 1.015 1.010 - 1.025   Blood, UA negative negative   pH, UA 5.5 5.0 - 8.0   Protein Ur, POC negative negative mg/dL   Urobilinogen, UA 1.0 0.2 or 1.0 E.U./dL   Nitrite, UA Negative Negative   Leukocytes, UA Small (1+) (A) Negative   No results found.    No data found.  Assessment & Plan:   1. Syncope, unspecified syncope type   2. Acute post-traumatic headache, not intractable   3. Neck pain   4. Acute pain of left shoulder  5. Acute midline low back pain without sciatica   6. S/P laparoscopic sleeve gastrectomy   7. Morbid obesity ([place])   8. Sickle cell trait ([place])     New onset syncope during exercise status post recent laparoscopic sleeve gastrectomy.  Obtain EKG and labs.  Refer to cardiology to rule out cardiac etiology.  No associated palpitations or chest pain associated with syncopal event.  No seizure activity associated with syncopal event.  Head trauma with posttraumatic headache: New onset.  Suffered head trauma with recent syncopal event during exercise.  Normal neurological exam.  Mild concussive symptoms at this time.  Recommend rest.  Avoid exercise for the next week.  Acute neck pain: New onset following head trauma and fall secondary to syncopal event.  Obtain cervical spine films.  Recommend rest, stretches, muscle relaxer.  No radicular symptoms at  this time.  If persistent within the upcoming month, refer to orthopedics.  Acute left shoulder pain: New.  Secondary to recent fall due to syncopal event.  Recommend rest, ice, home exercise program.  Avoid lifting to the left shoulder more than 5 pounds.  Acute midline low back pain without sciatica: New onset following fall.  Obtain lumbar spine films.  Recommend rest, regular stretching, regular ambulation.  Morbid obesity status post laparoscopic sleeve gastrectomy: Doing well postoperative period.  Obtain labs.  Encourage regular hydration to help with constipation.  Orders Placed This Encounter  Procedures  . DG Cervical Spine Complete    Standing Status:   Future    Number of Occurrences:   1    Standing Expiration Date:   09/21/2018    Order Specific Question:   Reason for Exam (SYMPTOM  OR DIAGNOSIS REQUIRED)    Answer:   syncope; fell and hit head; neck pain radiating to LEFT    Order Specific Question:   Is the patient pregnant?    Answer:   No    Order Specific Question:   Preferred imaging location?    Answer:   External  . DG Lumbar Spine 2-3 Views    Standing Status:   Future    Number of Occurrences:   1    Standing Expiration Date:   09/21/2018    Order Specific Question:   Reason for Exam (SYMPTOM  OR DIAGNOSIS REQUIRED)    Answer:   syncopal event; l shoulder pain    Order Specific Question:   Is the patient pregnant?    Answer:   No    Order Specific Question:   Preferred imaging location?    Answer:   External  . DG Shoulder Left    Standing Status:   Future    Number of Occurrences:   1    Standing Expiration Date:   09/21/2018    Order Specific Question:   Reason for Exam (SYMPTOM  OR DIAGNOSIS REQUIRED)    Answer:   syncopal event; l shoulder pain    Order Specific Question:   Is the patient pregnant?    Answer:   No    Order Specific Question:   Preferred imaging location?    Answer:   External  . Comprehensive metabolic panel  . Ambulatory referral to  Cardiology    Referral Priority:   Routine    Referral Type:   Consultation    Referral Reason:   Specialty Services Required    Requested Specialty:   Cardiology    Number of Visits Requested:   1  . Orthostatic vital signs  .  POCT CBC  . POCT glucose (manual entry)  . POCT urinalysis dipstick  . EKG 12-Lead   Meds ordered this encounter  Medications  . cyclobenzaprine (FLEXERIL) 5 MG tablet    Sig: Take 1 tablet (5 mg total) by mouth 3 (three) times daily as needed for muscle spasms.    Dispense:  30 tablet    Refill:  0    No follow-ups on file.   Garvin Ellena Elayne Guerin, M.D. Primary Care at [facility] previously [facility] [place] [address] [place],   [address] [phone] phone [phone] fax

## 2017-09-22 ENCOUNTER — Encounter: Payer: Self-pay | Admitting: Family Medicine

## 2017-09-22 LAB — COMPREHENSIVE METABOLIC PANEL
A/G RATIO: 1.1 — AB (ref 1.2–2.2)
ALT: 8 IU/L (ref 0–32)
AST: 11 IU/L (ref 0–40)
Albumin: 3.4 g/dL — ABNORMAL LOW (ref 3.5–5.5)
Alkaline Phosphatase: 79 IU/L (ref 39–117)
BUN/Creatinine Ratio: 12 (ref 9–23)
BUN: 8 mg/dL (ref 6–24)
Bilirubin Total: 0.2 mg/dL (ref 0.0–1.2)
CALCIUM: 9.2 mg/dL (ref 8.7–10.2)
CO2: 26 mmol/L (ref 20–29)
CREATININE: 0.69 mg/dL (ref 0.57–1.00)
Chloride: 103 mmol/L (ref 96–106)
GFR, EST AFRICAN AMERICAN: 120 mL/min/{1.73_m2} (ref >59)
GFR, EST NON AFRICAN AMERICAN: 104 mL/min/{1.73_m2} (ref >59)
GLOBULIN, TOTAL: 3.1 g/dL (ref 1.5–4.5)
Glucose: 83 mg/dL (ref 65–99)
Potassium: 3.9 mmol/L (ref 3.5–5.2)
Sodium: 144 mmol/L (ref 134–144)
TOTAL PROTEIN: 6.5 g/dL (ref 6.0–8.5)

## 2017-09-24 ENCOUNTER — Other Ambulatory Visit: Payer: Self-pay | Admitting: Family Medicine

## 2017-09-24 ENCOUNTER — Ambulatory Visit: Payer: Self-pay | Admitting: Skilled Nursing Facility1

## 2017-09-24 DIAGNOSIS — Z1231 Encounter for screening mammogram for malignant neoplasm of breast: Secondary | ICD-10-CM

## 2017-10-16 ENCOUNTER — Encounter: Payer: Self-pay | Admitting: Skilled Nursing Facility1

## 2017-10-16 ENCOUNTER — Encounter: Payer: 59 | Attending: General Surgery | Admitting: Skilled Nursing Facility1

## 2017-10-16 DIAGNOSIS — Z713 Dietary counseling and surveillance: Secondary | ICD-10-CM | POA: Insufficient documentation

## 2017-10-16 DIAGNOSIS — E669 Obesity, unspecified: Secondary | ICD-10-CM | POA: Diagnosis not present

## 2017-10-16 DIAGNOSIS — Z6841 Body Mass Index (BMI) 40.0 and over, adult: Secondary | ICD-10-CM | POA: Diagnosis not present

## 2017-10-16 NOTE — Patient Instructions (Addendum)
-  Aim for 4-5 of your water bottles  -Aim to have vegetables with every lunch and every dinner every day  -Eat protein the size of your palm and then start in on the vegetables   -Eat beans at least 5 times in a week  -Try kefir in the yogurt aisle   -Take your capsule multivitamin having already eaten and take it in the afternoon if issue in the morning    -Aim for 3 calciums a day   -Try the components of a salad first try with edamame

## 2017-10-16 NOTE — Progress Notes (Signed)
Post-Operative Sleeve Gastrectomy Surgery  Primary concerns today: Post-operative Bariatric Surgery Nutrition Management.  Pt states at about the 6th week she was feeling better feeling really weak before that due to lack of proper hydration. Pt states her sleep and energy level have been good now. Pt states any time she has introduced new foods she has chewed well and taken it slow. Pt states she has had to do an enema. Pt states she Does not like miralax or milk of magnesia. Pt states she is taking [place] multivitamins because she vomits the other multivitamins.   Surgery date: 07/30/2017 Surgery type: Sleeve  Start weight at [facility]: 264.6 Weight today: pt declined Weight change:   24-hr recall: 2-3 spoonfuls of peanut butter  B (AM): 2 [place] suasage links Snk (AM): yogurt or egg L (PM): protein shake or grilled chicken or pork chop or shrimp Snk (PM): spoon of peanut butter D (PM): grilled chicken and green beans or broccoli Snk (PM): peanut butter   Fluid intake: water sometimes with lemon or flavorings: 50.7 Estimated total protein intake: 60+  Medications: See List Supplementation: calcium and [place]   Using straws: no Drinking while eating: no Having you been chewing well:no Chewing/swallowing difficulties: no Changes in vision: no Changes to mood/headaches: no Hair loss/[place] to skin/Changes to nails: no Any difficulty focusing or concentrating: no Sweating: no Dizziness/Lightheaded:  Palpitations: no  Carbonated beverages: no N/V/D/C/GAS: 2 bowel movements a week  Abdominal Pain: no Dumping syndrome: no  Recent physical activity:  Stationary bike, eitptial, walking: 3-4 days a week  Progress Towards Goal(s):  In progress.  Handouts given during visit include:  Non-starchy veggies   Nutritional Diagnosis:  [place]-3.3 Overweight/obesity related to past poor dietary habits and physical inactivity as evidenced by patient w/ recent sleeve surgery following  dietary guidelines for continued weight loss.    Intervention:  Nutrition counseling for proper diet advancment. Goals: -Aim for 4-5 of your water bottles -Aim to have vegetables with every lunch and every dinner every day -Eat protein the size of your palm and then start in on the vegetables  -Eat beans at least 5 times in a week -Try kefir in the yogurt aisle  -Take your capsule multivitamin having already eaten and take it in the afternoon if issue in the morning   -Aim for 3 calciums a day  -Try the components of a salad first try with edamame   Teaching Method Utilized:  Visual Auditory Hands on  Barriers to learning/adherence to lifestyle change: none stated  Demonstrated degree of understanding via:  Teach Back   Monitoring/Evaluation:  Dietary intake, exercise, and body weight.

## 2017-10-17 ENCOUNTER — Ambulatory Visit: Payer: Self-pay

## 2017-10-25 DIAGNOSIS — D573 Sickle-cell trait: Secondary | ICD-10-CM | POA: Insufficient documentation

## 2017-10-25 DIAGNOSIS — Z9884 Bariatric surgery status: Secondary | ICD-10-CM | POA: Insufficient documentation

## 2017-10-29 ENCOUNTER — Other Ambulatory Visit: Payer: Self-pay

## 2017-10-29 ENCOUNTER — Other Ambulatory Visit: Payer: Self-pay | Admitting: Family Medicine

## 2017-10-29 ENCOUNTER — Telehealth: Payer: Self-pay | Admitting: Family Medicine

## 2017-10-29 ENCOUNTER — Ambulatory Visit (INDEPENDENT_AMBULATORY_CARE_PROVIDER_SITE_OTHER): Payer: 59 | Admitting: Family Medicine

## 2017-10-29 VITALS — BP 96/68 | HR 88 | Temp 98.3°F | Resp 16 | Ht 63.78 in | Wt 217.0 lb

## 2017-10-29 DIAGNOSIS — Z0000 Encounter for general adult medical examination without abnormal findings: Secondary | ICD-10-CM

## 2017-10-29 NOTE — Patient Instructions (Addendum)
Please schedule an appointment on the way out for skin check and a pap smear with Dr. Tamala Julian.  I will check in to getting you prescriptions for your vitamins so they can be covered by your FSA.    Fall Prevention in the Home Falls can cause injuries. They can happen to people of all ages. There are many things you can do to make your home safe and to help prevent falls. What can I do on the outside of my home?  Regularly fix the edges of walkways and driveways and fix any cracks.  Remove anything that might make you trip as you walk through a door, such as a raised step or threshold.  Trim any bushes or trees on the path to your home.  Use bright outdoor lighting.  Clear any walking paths of anything that might make someone trip, such as rocks or tools.  Regularly check to see if handrails are loose or broken. Make sure that both sides of any steps have handrails.  Any raised decks and porches should have guardrails on the edges.  Have any leaves, snow, or ice cleared regularly.  Use sand or salt on walking paths during winter.  Clean up any spills in your garage right away. This includes oil or grease spills. What can I do in the bathroom?  Use night lights.  Install grab bars by the toilet and in the tub and shower. Do not use towel bars as grab bars.  Use non-skid mats or decals in the tub or shower.  If you need to sit down in the shower, use a plastic, non-slip stool.  Keep the floor dry. Clean up any water that spills on the floor as soon as it happens.  Remove soap buildup in the tub or shower regularly.  Attach bath mats securely with double-sided non-slip rug tape.  Do not have throw rugs and other things on the floor that can make you trip. What can I do in the bedroom?  Use night lights.  Make sure that you have a light by your bed that is easy to reach.  Do not use any sheets or blankets that are too big for your bed. They should not hang down onto the  floor.  Have a firm chair that has side arms. You can use this for support while you get dressed.  Do not have throw rugs and other things on the floor that can make you trip. What can I do in the kitchen?  Clean up any spills right away.  Avoid walking on wet floors.  Keep items that you use a lot in easy-to-reach places.  If you need to reach something above you, use a strong step stool that has a grab bar.  Keep electrical cords out of the way.  Do not use floor polish or wax that makes floors slippery. If you must use wax, use non-skid floor wax.  Do not have throw rugs and other things on the floor that can make you trip. What can I do with my stairs?  Do not leave any items on the stairs.  Make sure that there are handrails on both sides of the stairs and use them. Fix handrails that are broken or loose. Make sure that handrails are as long as the stairways.  Check any carpeting to make sure that it is firmly attached to the stairs. Fix any carpet that is loose or worn.  Avoid having throw rugs at the top or bottom  of the stairs. If you do have throw rugs, attach them to the floor with carpet tape.  Make sure that you have a light switch at the top of the stairs and the bottom of the stairs. If you do not have them, ask someone to add them for you. What else can I do to help prevent falls?  Wear shoes that: ? Do not have high heels. ? Have rubber bottoms. ? Are comfortable and fit you well. ? Are closed at the toe. Do not wear sandals.  If you use a stepladder: ? Make sure that it is fully opened. Do not climb a closed stepladder. ? Make sure that both sides of the stepladder are locked into place. ? Ask someone to hold it for you, if possible.  Clearly mark and make sure that you can see: ? Any grab bars or handrails. ? First and last steps. ? Where the edge of each step is.  Use tools that help you move around (mobility aids) if they are needed. These  include: ? Canes. ? Walkers. ? Scooters. ? Crutches.  Turn on the lights when you go into a dark area. Replace any light bulbs as soon as they burn out.  Set up your furniture so you have a clear path. Avoid moving your furniture around.  If any of your floors are uneven, fix them.  If there are any pets around you, be aware of where they are.  Review your medicines with your doctor. Some medicines can make you feel dizzy. This can increase your chance of falling. Ask your doctor what other things that you can do to help prevent falls. This information is not intended to replace advice given to you by your health care provider. Make sure you discuss any questions you have with your health care provider. Document Released: 04/21/2009 Document Revised: 12/01/2015 Document Reviewed: 07/30/2014 Elsevier Interactive Patient Education  2018 [place] Maintenance, Female Adopting a healthy lifestyle and getting preventive care can go a long way to promote health and wellness. Talk with your health care provider about what schedule of regular examinations is right for you. This is a good chance for you to check in with your provider about disease prevention and staying healthy. In between checkups, there are plenty of things you can do on your own. Experts have done a lot of research about which lifestyle changes and preventive measures are most likely to keep you healthy. Ask your health care provider for more information. Weight and diet Eat a healthy diet  Be sure to include plenty of vegetables, fruits, low-fat dairy products, and lean protein.  Do not eat a lot of foods high in solid fats, added sugars, or salt.  Get regular exercise. This is one of the most important things you can do for your health. ? Most adults should exercise for at least 150 minutes each week. The exercise should increase your heart rate and make you sweat (moderate-intensity exercise). ? Most adults  should also do strengthening exercises at least twice a week. This is in addition to the moderate-intensity exercise.  Maintain a healthy weight  Body mass index (BMI) is a measurement that can be used to identify possible weight problems. It estimates body fat based on height and weight. Your health care provider can help determine your BMI and help you achieve or maintain a healthy weight.  For females 63 years of age and older: ? A BMI below 18.5 is considered underweight. ?  A BMI of 18.5 to 24.9 is normal. ? A BMI of 25 to 29.9 is considered overweight. ? A BMI of 30 and above is considered obese.  Watch levels of cholesterol and blood lipids  You should start having your blood tested for lipids and cholesterol at 48 years of age, then have this test every 5 years.  You may need to have your cholesterol levels checked more often if: ? Your lipid or cholesterol levels are high. ? You are older than 48 years of age. ? You are at high risk for heart disease.  Cancer screening Lung Cancer  Lung cancer screening is recommended for adults 49-10 years old who are at high risk for lung cancer because of a history of smoking.  A yearly low-dose CT scan of the lungs is recommended for people who: ? Currently smoke. ? Have quit within the past 15 years. ? Have at least a 30-pack-year history of smoking. A pack year is smoking an average of one pack of cigarettes a day for 1 year.  Yearly screening should continue until it has been 15 years since you quit.  Yearly screening should stop if you develop a health problem that would prevent you from having lung cancer treatment.  Breast Cancer  Practice breast self-awareness. This means understanding how your breasts normally appear and feel.  It also means doing regular breast self-exams. Let your health care provider know about any changes, no matter how small.  If you are in your 20s or 30s, you should have a clinical breast exam (CBE)  by a health care provider every 1-3 years as part of a regular health exam.  If you are 83 or older, have a CBE every year. Also consider having a breast X-ray (mammogram) every year.  If you have a family history of breast cancer, talk to your health care provider about genetic screening.  If you are at high risk for breast cancer, talk to your health care provider about having an MRI and a mammogram every year.  Breast cancer gene (BRCA) assessment is recommended for women who have family members with BRCA-related cancers. BRCA-related cancers include: ? Breast. ? Ovarian. ? Tubal. ? Peritoneal cancers.  Results of the assessment will determine the need for genetic counseling and BRCA1 and BRCA2 testing.  Cervical Cancer Your health care provider may recommend that you be screened regularly for cancer of the pelvic organs (ovaries, uterus, and vagina). This screening involves a pelvic examination, including checking for microscopic changes to the surface of your cervix (Pap test). You may be encouraged to have this screening done every 3 years, beginning at age 82.  For women ages 54-65, health care providers may recommend pelvic exams and Pap testing every 3 years, or they may recommend the Pap and pelvic exam, combined with testing for human papilloma virus (HPV), every 5 years. Some types of HPV increase your risk of cervical cancer. Testing for HPV may also be done on women of any age with unclear Pap test results.  Other health care providers may not recommend any screening for nonpregnant women who are considered low risk for pelvic cancer and who do not have symptoms. Ask your health care provider if a screening pelvic exam is right for you.  If you have had past treatment for cervical cancer or a condition that could lead to cancer, you need Pap tests and screening for cancer for at least 20 years after your treatment. If Pap tests have been  discontinued, your risk factors (such as  having a new sexual partner) need to be reassessed to determine if screening should resume. Some women have medical problems that increase the chance of getting cervical cancer. In these cases, your health care provider may recommend more frequent screening and Pap tests.  Colorectal Cancer  This type of cancer can be detected and often prevented.  Routine colorectal cancer screening usually begins at 48 years of age and continues through 48 years of age.  Your health care provider may recommend screening at an earlier age if you have risk factors for colon cancer.  Your health care provider may also recommend using home test kits to check for hidden blood in the stool.  A small camera at the end of a tube can be used to examine your colon directly (sigmoidoscopy or colonoscopy). This is done to check for the earliest forms of colorectal cancer.  Routine screening usually begins at age 63.  Direct examination of the colon should be repeated every 5-10 years through 48 years of age. However, you may need to be screened more often if early forms of precancerous polyps or small growths are found.  Skin Cancer  Check your skin from head to toe regularly.  Tell your health care provider about any new moles or changes in moles, especially if there is a change in a mole's shape or color.  Also tell your health care provider if you have a mole that is larger than the size of a pencil eraser.  Always use sunscreen. Apply sunscreen liberally and repeatedly throughout the day.  Protect yourself by wearing long sleeves, pants, a wide-brimmed hat, and sunglasses whenever you are outside.  Heart disease, diabetes, and high blood pressure  High blood pressure causes heart disease and increases the risk of stroke. High blood pressure is more likely to develop in: ? People who have blood pressure in the high end of the normal range (130-139/85-89 mm Hg). ? People who are overweight or  obese. ? People who are African American.  If you are 63-21 years of age, have your blood pressure checked every 3-5 years. If you are 10 years of age or older, have your blood pressure checked every year. You should have your blood pressure measured twice-once when you are at a hospital or clinic, and once when you are not at a hospital or clinic. Record the average of the two measurements. To check your blood pressure when you are not at a hospital or clinic, you can use: ? An automated blood pressure machine at a pharmacy. ? A home blood pressure monitor.  If you are between 24 years and 69 years old, ask your health care provider if you should take aspirin to prevent strokes.  Have regular diabetes screenings. This involves taking a blood sample to check your fasting blood sugar level. ? If you are at a normal weight and have a low risk for diabetes, have this test once every three years after 48 years of age. ? If you are overweight and have a high risk for diabetes, consider being tested at a younger age or more often. Preventing infection Hepatitis B  If you have a higher risk for hepatitis B, you should be screened for this virus. You are considered at high risk for hepatitis B if: ? You were born in a country where hepatitis B is common. Ask your health care provider which countries are considered high risk. ? Your parents were born in  a high-risk country, and you have not been immunized against hepatitis B (hepatitis B vaccine). ? You have HIV or AIDS. ? You use needles to inject street drugs. ? You live with someone who has hepatitis B. ? You have had sex with someone who has hepatitis B. ? You get hemodialysis treatment. ? You take certain medicines for conditions, including cancer, organ transplantation, and autoimmune conditions.  Hepatitis C  Blood testing is recommended for: ? Everyone born from [birth year] through 1966. ? Anyone with known risk factors for hepatitis  C.  Sexually transmitted infections (STIs)  You should be screened for sexually transmitted infections (STIs) including gonorrhea and chlamydia if: ? You are sexually active and are younger than 48 years of age. ? You are older than 47 years of age and your health care provider tells you that you are at risk for this type of infection. ? Your sexual activity has changed since you were last screened and you are at an increased risk for chlamydia or gonorrhea. Ask your health care provider if you are at risk.  If you do not have HIV, but are at risk, it may be recommended that you take a prescription medicine daily to prevent HIV infection. This is called pre-exposure prophylaxis (PrEP). You are considered at risk if: ? You are sexually active and do not regularly use condoms or know the HIV status of your partner(s). ? You take drugs by injection. ? You are sexually active with a partner who has HIV.  Talk with your health care provider about whether you are at high risk of being infected with HIV. If you choose to begin PrEP, you should first be tested for HIV. You should then be tested every 3 months for as long as you are taking PrEP. Pregnancy  If you are premenopausal and you may become pregnant, ask your health care provider about preconception counseling.  If you may become pregnant, take 400 to 800 micrograms (mcg) of folic acid every day.  If you want to prevent pregnancy, talk to your health care provider about birth control (contraception). Osteoporosis and menopause  Osteoporosis is a disease in which the bones lose minerals and strength with aging. This can result in serious bone fractures. Your risk for osteoporosis can be identified using a bone density scan.  If you are 82 years of age or older, or if you are at risk for osteoporosis and fractures, ask your health care provider if you should be screened.  Ask your health care provider whether you should take a calcium or  vitamin D supplement to lower your risk for osteoporosis.  Menopause may have certain physical symptoms and risks.  Hormone replacement therapy may reduce some of these symptoms and risks. Talk to your health care provider about whether hormone replacement therapy is right for you. Follow these instructions at home:  Schedule regular health, dental, and eye exams.  Stay current with your immunizations.  Do not use any tobacco products including cigarettes, chewing tobacco, or electronic cigarettes.  If you are pregnant, do not drink alcohol.  If you are breastfeeding, limit how much and how often you drink alcohol.  Limit alcohol intake to no more than 1 drink per day for nonpregnant women. One drink equals 12 ounces of beer, 5 ounces of wine, or 1 ounces of hard liquor.  Do not use street drugs.  Do not share needles.  Ask your health care provider for help if you need support or information about  quitting drugs.  Tell your health care provider if you often feel depressed.  Tell your health care provider if you have ever been abused or do not feel safe at home. This information is not intended to replace advice given to you by your health care provider. Make sure you discuss any questions you have with your health care provider. Document Released: 01/08/2011 Document Revised: 12/01/2015 Document Reviewed: 03/29/2015 Elsevier Interactive Patient Education  2018 [place] you for enrolling in [place]. Please follow the instructions below to securely access your online medical record. MyChart allows you to send messages to your doctor, view your test results, manage appointments, and more.   How Do I Sign Up? 1. In your Internet browser, go to [employer] and enter [email or web address]. 2. Click on the Sign Up Now link in the Sign In box. You will see the New Member Sign Up page. 3. Enter your MyChart Access Code exactly as it appears below. You will not  need to use this code after you've completed the sign-up process. If you do not sign up before the expiration date, you must request a new code.  MyChart Access Code: [email or web address] Expires: 11/05/2017 11:20 AM  4. Enter your Social Security Number ([id number]) and Date of Birth (mm/dd/yyyy) as indicated and click Submit. You will be taken to the next sign-up page. 5. Create a MyChart ID. This will be your MyChart login ID and cannot be changed, so think of one that is secure and easy to remember. 6. Create a MyChart password. You can change your password at any time. 7. Enter your Password Reset Question and Answer. This can be used at a later time if you forget your password.  8. Enter your e-mail address. You will receive e-mail notification when new information is available in [place]. 9. Click Sign Up. You can now view your medical record.   Additional Information Remember, MyChart is NOT to be used for urgent needs. For medical emergencies, dial 911.

## 2017-10-29 NOTE — Progress Notes (Signed)
Subjective:   Sandra Jenkins is a 47 y.o. female who presents for Medicare Annual (Subsequent) preventive examination.      Objective:     Vitals: BP 96/68   Pulse 88   Temp 98.3 F (36.8 C)   Resp 16   Ht 5' 3.78" (1.62 m)   Wt 217 lb (98.4 kg)   LMP 10/03/2017 (Exact Date)   BMI 37.51 kg/m   Body mass index is 37.51 kg/m.  Advanced Directives 07/30/2017 07/29/2017 03/14/2017 04/15/2015 03/24/2014 09/28/2013  Does Patient Have a Medical Advance Directive? No No No No No Patient would like information  Would patient like information on creating a medical advance directive? No - Patient declined No - Patient declined No - Patient declined No - patient declined information Yes - Scientist, clinical (histocompatibility and immunogenetics) given -    Tobacco Social History   Tobacco Use  Smoking Status Never Smoker  Smokeless Tobacco Never Used     Counseling given: Not Answered   Clinical Intake:                       Past Medical History:  Diagnosis Date  . Anemia    Sickle Cell Trait  . Arthritis    BILATERAL KNEES; DDD lumbar spine  . DDD (degenerative disc disease), lumbar   . GERD (gastroesophageal reflux disease)   . Pre-diabetes    Past Surgical History:  Procedure Laterality Date  . BREAST SURGERY     breast reduction  . CESAREAN SECTION    . LAPAROSCOPIC GASTRIC SLEEVE RESECTION N/A 07/30/2017   Procedure: LAPAROSCOPIC GASTRIC SLEEVE RESECTION, UPPER ENDO;  Surgeon: Kinsinger, Arta Bruce, MD;  Location: [facility];  Service: General;  Laterality: N/A;  . REDUCTION MAMMAPLASTY Bilateral 2004  . TUBAL LIGATION     Family History  Problem Relation Age of Onset  . Heart disease Maternal Grandmother   . Heart attack Maternal Grandmother   . Heart disease Maternal Grandfather   . Heart attack Maternal Grandfather   . Heart disease Paternal Grandmother   . Heart attack Paternal Grandmother   . Arthritis Father    Social History   Socioeconomic History  . Marital status:  Married    Spouse name: Not on file  . Number of children: 2  . Years of education: Not on file  . Highest education level: Not on file  Occupational History  . 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 Financial resource strain: Not on file  . Food insecurity:    Worry: Never true    Inability: Never true  . Transportation needs:    Medical: Not on file    Non-medical: Not on file  Tobacco Use  . Smoking status: Never Smoker  . Smokeless tobacco: Never Used  Substance and Sexual Activity  . Alcohol use: Yes    Alcohol/week: 1.2 - 1.8 oz    Types: 2 - 3 Standard drinks or equivalent per week    Comment: 2/month  . Drug use: No  . Sexual activity: Yes  Lifestyle  . Physical activity:    Days per week: Not on file    Minutes per session: Not on file  . Stress: Not on file  Relationships  . Social connections:    Talks on phone: Not on file    Gets together: Not on file    Attends religious service: Not on file  Active member of club or organization: Not on file    Attends meetings of clubs or organizations: Not on file    Relationship status: Not on file  Other Topics Concern  . 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      Tobacco: none      Alcohol: wine weekends      Exercise: walking; cannot get on elliptical or treadmill; three days per week for 30 minutes      Seatbelt: 100%; no texting.    Outpatient Encounter Medications as of 10/29/2017  Medication Sig  . Calcium Carbonate-Vitamin D (CALCIUM 500 + D PO) Take 1 tablet by mouth 2 (two) times daily.  . Iron-Folic Acid-Vit F02 (IRON FORMULA) 27-400-100 MG-MCG-MCG CAPS Take 1 capsule by mouth daily.  . Multiple Vitamin  (MULTIVITAMIN WITH MINERALS) TABS tablet Take 1 tablet by mouth daily.  Marland Kitchen triamcinolone cream (KENALOG) 0.1 % Apply 1 application topically 2 (two) times daily.  . [DISCONTINUED] cyclobenzaprine (FLEXERIL) 5 MG tablet Take 1 tablet (5 mg total) by mouth 3 (three) times daily as needed for muscle spasms. (Patient not taking: Reported on 10/29/2017)   No facility-administered encounter medications on file as of 10/29/2017.     Activities of Daily Living In your present state of health, do you have any difficulty performing the following activities: 10/29/2017 07/30/2017  Hearing? N N  Vision? N N  Difficulty concentrating or making decisions? N N  Walking or climbing stairs? Y N  Dressing or bathing? N N  Doing errands, shopping? N N  Preparing Food and eating ? N -  Using the Toilet? N -  In the past six months, have you accidently leaked urine? N -  Do you have problems with loss of bowel control? N -  Managing your Medications? N -  Managing your Finances? N -  Housekeeping or managing your Housekeeping? N -  Some recent data might be hidden    Patient Care Team: Wardell Honour, MD as PCP - General (Family Medicine) Kinsinger, Arta Bruce, MD as Consulting Physician (General Surgery)    Assessment:   This is a routine wellness examination for Sandra Jenkins.  Exercise Activities and Dietary recommendations Current Exercise Habits: Home exercise routine, Type of exercise: strength training/weights;Other - see comments;treadmill(elliptical, bike), Time (Minutes): 60, Frequency (Times/Week): 3, Weekly Exercise (Minutes/Week): 180, Intensity: Intense, Exercise limited by: None identified  Goals    . Weight (lb) < 180 lb (81.6 kg) (pt-stated)     Would like to lose weight. Will maintain physical activity for at least 3-4 times a week for 1 hour.        Fall Risk Fall Risk  10/29/2017 09/21/2017 08/10/2017 07/25/2017 07/18/2017  Falls in the past year? Yes Yes No No No  Number falls in past  yr: 1 1 - - -  Injury with Fall? Yes Yes - - -  Risk Factor Category  High Fall Risk - - - -  Risk for fall due to : Other (Comment) - - - -  Risk for fall due to: Comment Released too soon by surgeon, fainted at gym, saw Dr. Tamala Julian 09/21/17.  - - - -  Follow up Follow up appointment - - - -  Is the patient's home free of loose throw rugs in walkways, pet beds, electrical cords, etc?   yes      Grab bars in the bathroom? no      Handrails on the stairs?   no, lives in apartment      Adequate lighting?   yes  Timed Get Up and Go performed: Passed  Depression Screen PHQ 2/9 Scores 10/29/2017 09/21/2017 08/10/2017 07/25/2017  PHQ - 2 Score 1 0 0 0     Cognitive Function     6CIT Screen 10/29/2017  What Year? 0 points  What month? 0 points  What time? 0 points  Count back from 20 0 points  Months in reverse 0 points  Repeat phrase 0 points  Total Score 0    Immunization History  Administered Date(s) Administered  . Tdap 08/08/2016    Qualifies for Shingles Vaccine?   Screening Tests Health Maintenance  Topic Date Due  . INFLUENZA VACCINE  02/06/2018  . PAP SMEAR  08/09/2019  . TETANUS/TDAP  08/08/2026  . HIV Screening  Completed    Cancer Screenings: Lung: Low Dose CT Chest recommended if Age 71-80 years, 30 pack-year currently smoking OR have quit w/in 15years. Patient does not qualify. Breast:  Up to date on Mammogram? Yes   Up to date of Bone Density/Dexa? Yes, not due Colorectal: Not Due      Plan:    Follow up with Dr. Tamala Julian in 12/2017 from 09/2017 office visit.    Please review advanced directive packet/information and return to clinic.   I have personally reviewed and noted the following in the patient's chart:   . Medical and social history . Use of alcohol, tobacco or illicit drugs  . Current medications and supplements . Functional ability and status . Nutritional status . Physical activity . Advanced directives . List of other  physicians . Hospitalizations, surgeries, and ER visits in previous 12 months . Vitals . Screenings to include cognitive, depression, and falls . Referrals and appointments  In addition, I have reviewed and discussed with patient certain preventive protocols, quality metrics, and best practice recommendations. A written personalized care plan for preventive services as well as general preventive health recommendations were provided to patient.     Delle Reining, RN  10/29/2017

## 2017-10-29 NOTE — Telephone Encounter (Signed)
Patient is requesting prescriptions for Calcium and Vitamin D, Iron Formula, and Multivitamin prescriptions to be written for her so HSA can cover the expense.

## 2017-11-02 ENCOUNTER — Ambulatory Visit: Payer: 59 | Admitting: Family Medicine

## 2017-11-05 MED ORDER — IRON FORMULA 27-400-100 MG-MCG-MCG PO CAPS: 1.0000 | ORAL_CAPSULE | Freq: Every day | ORAL | 3 refills | Status: AC

## 2017-11-05 MED ORDER — CALCIUM CARB-CHOLECALCIFEROL 500-400 MG-UNIT PO TABS: 1.0000 | ORAL_TABLET | Freq: Two times a day (BID) | ORAL | 3 refills | Status: AC

## 2017-11-05 MED ORDER — ADULT MULTIVITAMIN W/MINERALS CH: 1.0000 | ORAL_TABLET | Freq: Every day | ORAL | 3 refills | Status: AC

## 2017-11-05 NOTE — Telephone Encounter (Signed)
Orders for vitamins pended. Provider, please sign if agreeable.

## 2017-11-05 NOTE — Telephone Encounter (Signed)
Call patient --- recommend she contact her general surgeon to send in specific vitamins that he recommends after her gastric sleeve.

## 2017-11-08 ENCOUNTER — Ambulatory Visit (INDEPENDENT_AMBULATORY_CARE_PROVIDER_SITE_OTHER): Payer: 59 | Admitting: Family Medicine

## 2017-11-08 ENCOUNTER — Other Ambulatory Visit: Payer: Self-pay

## 2017-11-08 ENCOUNTER — Encounter: Payer: Self-pay | Admitting: Family Medicine

## 2017-11-08 VITALS — BP 102/64 | HR 96 | Temp 99.0°F | Ht 65.5 in | Wt 213.2 lb

## 2017-11-08 DIAGNOSIS — Z13220 Encounter for screening for lipoid disorders: Secondary | ICD-10-CM

## 2017-11-08 DIAGNOSIS — Z0000 Encounter for general adult medical examination without abnormal findings: Secondary | ICD-10-CM | POA: Diagnosis not present

## 2017-11-08 DIAGNOSIS — R7303 Prediabetes: Secondary | ICD-10-CM

## 2017-11-08 DIAGNOSIS — Z9884 Bariatric surgery status: Secondary | ICD-10-CM

## 2017-11-08 DIAGNOSIS — Z1329 Encounter for screening for other suspected endocrine disorder: Secondary | ICD-10-CM | POA: Diagnosis not present

## 2017-11-08 DIAGNOSIS — Z130 Encounter for screening for diseases of the blood and blood-forming organs and certain disorders involving the immune mechanism: Secondary | ICD-10-CM | POA: Diagnosis not present

## 2017-11-08 DIAGNOSIS — N898 Other specified noninflammatory disorders of vagina: Secondary | ICD-10-CM | POA: Diagnosis not present

## 2017-11-08 DIAGNOSIS — Z1231 Encounter for screening mammogram for malignant neoplasm of breast: Secondary | ICD-10-CM

## 2017-11-08 MED ORDER — FLUCONAZOLE 150 MG PO TABS: 150.0000 mg | ORAL_TABLET | ORAL | 0 refills | Status: AC

## 2017-11-08 NOTE — Patient Instructions (Addendum)
   IF you received an x-ray today, you will receive an invoice from [facility]. Please contact [facility] at [phone] with questions or concerns regarding your invoice.   IF you received labwork today, you will receive an invoice from [facility]. Please contact [facility] at [phone] with questions or concerns regarding your invoice.   Our billing staff will not be able to assist you with questions regarding bills from these companies.  You will be contacted with the lab results as soon as they are available. The fastest way to get your results is to activate your My Chart account. Instructions are located on the last page of this paperwork. If you have not heard from us regarding the results in 2 weeks, please contact this office.     Preventive Care 40-64 Years, Female Preventive care refers to lifestyle choices and visits with your health care provider that can promote health and wellness. What does preventive care include?  A yearly physical exam. This is also called an annual well check.  Dental exams once or twice a year.  Routine eye exams. Ask your health care provider how often you should have your eyes checked.  Personal lifestyle choices, including: ? Daily care of your teeth and gums. ? Regular physical activity. ? Eating a healthy diet. ? Avoiding tobacco and drug use. ? Limiting alcohol use. ? Practicing safe sex. ? Taking low-dose aspirin daily starting at age 50. ? Taking vitamin and mineral supplements as recommended by your health care provider. What happens during an annual well check? The services and screenings done by your health care provider during your annual well check will depend on your age, overall health, lifestyle risk factors, and family history of disease. Counseling Your health care provider may ask you questions about your:  Alcohol use.  Tobacco use.  Drug use.  Emotional well-being.  Home and relationship  well-being.  Sexual activity.  Eating habits.  Work and work environment.  Method of birth control.  Menstrual cycle.  Pregnancy history.  Screening You may have the following tests or measurements:  Height, weight, and BMI.  Blood pressure.  Lipid and cholesterol levels. These may be checked every 5 years, or more frequently if you are over 50 years old.  Skin check.  Lung cancer screening. You may have this screening every year starting at age 55 if you have a 30-pack-year history of smoking and currently smoke or have quit within the past 15 years.  Fecal occult blood test (FOBT) of the stool. You may have this test every year starting at age 50.  Flexible sigmoidoscopy or colonoscopy. You may have a sigmoidoscopy every 5 years or a colonoscopy every 10 years starting at age 50.  Hepatitis C blood test.  Hepatitis B blood test.  Sexually transmitted disease (STD) testing.  Diabetes screening. This is done by checking your blood sugar (glucose) after you have not eaten for a while (fasting). You may have this done every 1-3 years.  Mammogram. This may be done every 1-2 years. Talk to your health care provider about when you should start having regular mammograms. This may depend on whether you have a family history of breast cancer.  BRCA-related cancer screening. This may be done if you have a family history of breast, ovarian, tubal, or peritoneal cancers.  Pelvic exam and Pap test. This may be done every 3 years starting at age 21. Starting at age 30, this may be done every 5 years if you   have a Pap test in combination with an HPV test.  Bone density scan. This is done to screen for osteoporosis. You may have this scan if you are at high risk for osteoporosis.  Discuss your test results, treatment options, and if necessary, the need for more tests with your health care provider. Vaccines Your health care provider may recommend certain vaccines, such  as:  Influenza vaccine. This is recommended every year.  Tetanus, diphtheria, and acellular pertussis (Tdap, Td) vaccine. You may need a Td booster every 10 years.  Varicella vaccine. You may need this if you have not been vaccinated.  Zoster vaccine. You may need this after age 60.  Measles, mumps, and rubella (MMR) vaccine. You may need at least one dose of MMR if you were born in 1957 or later. You may also need a second dose.  Pneumococcal 13-valent conjugate (PCV13) vaccine. You may need this if you have certain conditions and were not previously vaccinated.  Pneumococcal polysaccharide (PPSV23) vaccine. You may need one or two doses if you smoke cigarettes or if you have certain conditions.  Meningococcal vaccine. You may need this if you have certain conditions.  Hepatitis A vaccine. You may need this if you have certain conditions or if you travel or work in places where you may be exposed to hepatitis A.  Hepatitis B vaccine. You may need this if you have certain conditions or if you travel or work in places where you may be exposed to hepatitis B.  Haemophilus influenzae type b (Hib) vaccine. You may need this if you have certain conditions.  Talk to your health care provider about which screenings and vaccines you need and how often you need them. This information is not intended to replace advice given to you by your health care provider. Make sure you discuss any questions you have with your health care provider. Document Released: 07/22/2015 Document Revised: 03/14/2016 Document Reviewed: 04/26/2015 Elsevier Interactive Patient Education  2018 Elsevier Inc.  

## 2017-11-08 NOTE — Progress Notes (Signed)
5/3/201911:02 AM  Sandra Jenkins 01/10/1971, 47 y.o. female [id number]  Chief Complaint  Patient presents with  . Gynecologic Exam    complete annual physical, having some vaginal discharge    HPI:   Patient is a 47 y.o. female with past medical history significant for gastric sleeve, prediabetes who presents today for CPE  Last CPE 07/2016 Cervical Cancer Screening: 07/2016, neg pap and HPV Breast Cancer Screening: 10/2016, birads 1 Colorectal Cancer Screening: n/a Bone Density Testing: n/a HIV Screening: 2017 STI Screening: 2017 Seasonal Influenza Vaccination: will need this season Td/Tdap Vaccination: 07/2016 Pneumococcal Vaccination: n/a Zoster Vaccination: n/a Frequency of Dental evaluation: Q6 months Frequency of Eye evaluation: yearly BC: BTL Sees nurtitionist, does not see surgeon anymore Exercises 3-4 times a week Having about 2 weeks of itchy white vaginal discharge  Fall Risk  11/08/2017 10/29/2017 09/21/2017 08/10/2017 07/25/2017  Falls in the past year? No Yes Yes No No  Number falls in past yr: - 1 1 - -  Injury with Fall? - Yes Yes - -  Risk Factor Category  - High Fall Risk - - -  Risk for fall due to : - Other (Comment) - - -  Risk for fall due to: Comment - Released too soon by surgeon, fainted at gym, saw Dr. Tamala Julian 09/21/17.  - - -  Follow up - Follow up appointment - - -     Depression screen [facility] 2/9 11/08/2017 10/29/2017 09/21/2017  Decreased Interest 0 0 0  Down, Depressed, Hopeless 0 1 0  PHQ - 2 Score 0 1 0    No Known Allergies  Prior to Admission medications   Medication Sig Start Date End Date Taking? Authorizing Provider  Calcium Carb-Cholecalciferol (CALCIUM 500+D) 500-400 MG-UNIT TABS Take 1 tablet by mouth 2 (two) times daily. 11/05/17  Yes Wardell Honour, MD  Multiple Vitamin (MULTIVITAMIN WITH MINERALS) TABS tablet Take 1 tablet by mouth daily. 11/05/17  Yes Wardell Honour, MD  Iron-Folic Acid-Vit V95 (IRON FORMULA) 27-400-100 MG-MCG-MCG  CAPS Take 1 capsule by mouth daily. 11/05/17   Wardell Honour, MD  triamcinolone cream (KENALOG) 0.1 % Apply 1 application topically 2 (two) times daily. 08/10/17   Wardell Honour, MD    Past Medical History:  Diagnosis Date  . Anemia    Sickle Cell Trait  . Arthritis    BILATERAL KNEES; DDD lumbar spine  . DDD (degenerative disc disease), lumbar   . GERD (gastroesophageal reflux disease)   . Pre-diabetes     Past Surgical History:  Procedure Laterality Date  . BREAST SURGERY     breast reduction  . CESAREAN SECTION    . LAPAROSCOPIC GASTRIC SLEEVE RESECTION N/A 07/30/2017   Procedure: LAPAROSCOPIC GASTRIC SLEEVE RESECTION, UPPER ENDO;  Surgeon: Kinsinger, Arta Bruce, MD;  Location: [facility];  Service: General;  Laterality: N/A;  . REDUCTION MAMMAPLASTY Bilateral 2004  . TUBAL LIGATION      Social History   Tobacco Use  . Smoking status: Never Smoker  . Smokeless tobacco: Never Used  Substance Use Topics  . Alcohol use: Yes    Alcohol/week: 1.2 - 1.8 oz    Types: 2 - 3 Standard drinks or equivalent per week    Comment: 2/month    Family History  Problem Relation Age of Onset  . Heart disease Maternal Grandmother   . Heart attack Maternal Grandmother   . Heart disease Maternal Grandfather   . Heart attack Maternal Grandfather   . Heart  disease Paternal Grandmother   . Heart attack Paternal Grandmother   . Arthritis Father     Review of Systems  Constitutional: Negative for chills and fever.  HENT: Negative for congestion, ear pain and sore throat.   Eyes: Negative for blurred vision and double vision.  Respiratory: Negative for cough and shortness of breath.   Cardiovascular: Negative for chest pain, palpitations and leg swelling.  Gastrointestinal: Negative for abdominal pain, blood in stool, constipation, diarrhea, melena, nausea and vomiting.  Genitourinary: Negative for dysuria and hematuria.       Neg breast lumps or nipple discharge Neg pelvic pain,  dyspareunia, abnormal vaginal bleeding  Musculoskeletal: Positive for joint pain (right knee pain, known OA, intermittent).  Neurological: Negative for dizziness, loss of consciousness and headaches.  Psychiatric/Behavioral: Negative for depression. The patient is not nervous/anxious.      OBJECTIVE:  Blood pressure 102/64, pulse 96, temperature 99 F (37.2 C), temperature source Oral, height 5' 5.5" (1.664 m), weight 213 lb 3.2 oz (96.7 kg), SpO2 97 %.  Wt Readings from Last 3 Encounters:  11/08/17 213 lb 3.2 oz (96.7 kg)  10/29/17 217 lb (98.4 kg)  09/21/17 231 lb 9.6 oz (105.1 kg)    Physical Exam  Constitutional: She is oriented to person, place, and time. She appears well-developed and well-nourished.  HENT:  Head: Normocephalic and atraumatic.  Right Ear: Hearing, tympanic membrane, external ear and ear canal normal.  Left Ear: Hearing, tympanic membrane, external ear and ear canal normal.  Mouth/Throat: Oropharynx is clear and moist.  Eyes: Pupils are equal, round, and reactive to light. Conjunctivae and EOM are normal.  Neck: Neck supple. No thyromegaly present.  Cardiovascular: Normal rate, regular rhythm, normal heart sounds and intact distal pulses. Exam reveals no gallop and no friction rub.  No murmur heard. Pulmonary/Chest: Effort normal and breath sounds normal. She has no wheezes. She has no rhonchi. She has no rales. Right breast exhibits no inverted nipple, no mass, no nipple discharge, no skin change and no tenderness. Left breast exhibits no inverted nipple, no mass, no nipple discharge, no skin change and no tenderness. Breasts are symmetrical.  Abdominal: Soft. Bowel sounds are normal. She exhibits no distension. There is no hepatosplenomegaly. There is no tenderness. There is no guarding.  Genitourinary: There is no rash or lesion on the right labia. There is no rash or lesion on the left labia. Uterus is not enlarged, not fixed and not tender. Cervix exhibits no  motion tenderness, no discharge and no friability. Right adnexum displays no mass and no tenderness. Left adnexum displays no mass and no tenderness. No erythema in the vagina. Vaginal discharge found.  Musculoskeletal: Normal range of motion. She exhibits no edema.  Lymphadenopathy:    She has no cervical adenopathy.    She has no axillary adenopathy.       Right: No supraclavicular adenopathy present.       Left: No supraclavicular adenopathy present.  Neurological: She is alert and oriented to person, place, and time. She has normal reflexes.  Skin: Skin is warm and dry.  Psychiatric: She has a normal mood and affect.  Nursing note and vitals reviewed.   ASSESSMENT and PLAN  1. Annual physical exam No concerns per history or exam. Routine HCM labs ordered. HCM reviewed/discussed. Anticipatory guidance regarding healthy weight, lifestyle and choices given.    2. S/P laparoscopic sleeve gastrectomy - Vitamin D, 25-hydroxy - Ferritin - Vitamin B12  3. Vaginal discharge - WET PREP  FOR TRICH, YEAST, CLUE  4. Pre-diabetes - CMP14+EGFR - Hemoglobin A1c  5. Visit for screening mammogram - MM DIGITAL SCREENING BILATERAL; Future  6. Screening for lipid disorders - Lipid panel  7. Screening for deficiency anemia - CBC with Differential/Platelet  8. Screening for thyroid disorder - TSH  Other orders - fluconazole (DIFLUCAN) 150 MG tablet; Take 1 tablet (150 mg total) by mouth every 3 (three) days.  Return in about 6 months (around 05/11/2018).    Rutherford Guys, MD [facility] [place], [place] [address] Ph.  [phone] Fax [phone]

## 2017-11-09 LAB — CMP14+EGFR
ALT: 6 IU/L (ref 0–32)
AST: 10 IU/L (ref 0–40)
Albumin/Globulin Ratio: 1.4 (ref 1.2–2.2)
Albumin: 4 g/dL (ref 3.5–5.5)
Alkaline Phosphatase: 70 IU/L (ref 39–117)
BUN/Creatinine Ratio: 11 (ref 9–23)
BUN: 10 mg/dL (ref 6–24)
Bilirubin Total: 0.4 mg/dL (ref 0.0–1.2)
CO2: 23 mmol/L (ref 20–29)
Calcium: 9.3 mg/dL (ref 8.7–10.2)
Chloride: 102 mmol/L (ref 96–106)
Creatinine, Ser: 0.87 mg/dL (ref 0.57–1.00)
GFR calc Af Amer: 91 mL/min/{1.73_m2} (ref >59)
GFR calc non Af Amer: 79 mL/min/{1.73_m2} (ref >59)
Globulin, Total: 2.8 g/dL (ref 1.5–4.5)
Glucose: 77 mg/dL (ref 65–99)
Potassium: 4.2 mmol/L (ref 3.5–5.2)
Sodium: 139 mmol/L (ref 134–144)
Total Protein: 6.8 g/dL (ref 6.0–8.5)

## 2017-11-09 LAB — CBC WITH DIFFERENTIAL/PLATELET
Basophils Absolute: 0 10*3/uL (ref 0.0–0.2)
Basos: 0 %
EOS (ABSOLUTE): 0 10*3/uL (ref 0.0–0.4)
Eos: 1 %
Hematocrit: 38.2 % (ref 34.0–46.6)
Hemoglobin: 11.9 g/dL (ref 11.1–15.9)
Immature Grans (Abs): 0 10*3/uL (ref 0.0–0.1)
Immature Granulocytes: 0 %
Lymphocytes Absolute: 2.8 10*3/uL (ref 0.7–3.1)
Lymphs: 43 %
MCH: 25.6 pg — ABNORMAL LOW (ref 26.6–33.0)
MCHC: 31.2 g/dL — ABNORMAL LOW (ref 31.5–35.7)
MCV: 82 fL (ref 79–97)
Monocytes Absolute: 0.4 10*3/uL (ref 0.1–0.9)
Monocytes: 7 %
Neutrophils Absolute: 3.3 10*3/uL (ref 1.4–7.0)
Neutrophils: 49 %
Platelets: 286 10*3/uL (ref 150–379)
RBC: 4.64 x10E6/uL (ref 3.77–5.28)
RDW: 16.5 % — ABNORMAL HIGH (ref 12.3–15.4)
WBC: 6.6 10*3/uL (ref 3.4–10.8)

## 2017-11-09 LAB — TSH: TSH: 2.2 u[IU]/mL (ref 0.450–4.500)

## 2017-11-09 LAB — LIPID PANEL
Chol/HDL Ratio: 3.1 ratio (ref 0.0–4.4)
Cholesterol, Total: 146 mg/dL (ref 100–199)
HDL: 47 mg/dL (ref >39)
LDL Calculated: 82 mg/dL (ref 0–99)
Triglycerides: 84 mg/dL (ref 0–149)
VLDL Cholesterol Cal: 17 mg/dL (ref 5–40)

## 2017-11-09 LAB — VITAMIN B12: Vitamin B-12: 678 pg/mL (ref 232–1245)

## 2017-11-09 LAB — FERRITIN: Ferritin: 61 ng/mL (ref 15–150)

## 2017-11-09 LAB — WET PREP FOR TRICH, YEAST, CLUE
Clue Cell Exam: NEGATIVE
Trichomonas Exam: NEGATIVE
Yeast Exam: NEGATIVE

## 2017-11-09 LAB — HEMOGLOBIN A1C
Est. average glucose Bld gHb Est-mCnc: 91 mg/dL
Hgb A1c MFr Bld: 4.8 % (ref 4.8–5.6)

## 2017-11-09 LAB — VITAMIN D 25 HYDROXY (VIT D DEFICIENCY, FRACTURES): Vit D, 25-Hydroxy: 44.7 ng/mL (ref 30.0–100.0)

## 2017-11-14 ENCOUNTER — Ambulatory Visit: Payer: 59 | Admitting: Family Medicine

## 2017-11-14 ENCOUNTER — Encounter: Payer: Self-pay | Admitting: Skilled Nursing Facility1

## 2017-11-14 ENCOUNTER — Encounter: Payer: 59 | Attending: General Surgery | Admitting: Skilled Nursing Facility1

## 2017-11-14 DIAGNOSIS — E669 Obesity, unspecified: Secondary | ICD-10-CM | POA: Diagnosis not present

## 2017-11-14 DIAGNOSIS — Z6841 Body Mass Index (BMI) 40.0 and over, adult: Secondary | ICD-10-CM | POA: Diagnosis not present

## 2017-11-14 DIAGNOSIS — Z713 Dietary counseling and surveillance: Secondary | ICD-10-CM | POA: Diagnosis present

## 2017-11-14 NOTE — Patient Instructions (Signed)
-  Have food on your stomach when start the capsule

## 2017-11-14 NOTE — Progress Notes (Signed)
Post-Operative Sleeve Gastrectomy Surgery  Primary concerns today: Post-operative Bariatric Surgery Nutrition Management.  Pt states she is having a bowel movement every 2 days. Pt states she is fearful of adding starchy vegetables. Pt states she is switching jobs and also insurance so these nutrition visits ,ay not be covered.   Surgery date: 07/30/2017 Surgery type: Sleeve  Start weight at [facility]: 264.6 Weight today: pt declined Weight change:   24-hr recall: 1 spoonful of peanut butter  B (8-9AM): 2 [place] suasage links and 1 egg or premier protein shake  Snk (AM): L (12-1PM): protein shake or grilled chicken or pork chop or shrimp with vegetable Snk (3:30PM): yogurt D (6:30PM): grilled chicken and green beans or broccoli Snk (9PM): 1 spoonful peanut butter   Fluid intake: water sometimes with lemon or flavorings: 67.6  Estimated total protein intake: 60+  Medications: See List Supplementation: calcium and chewable bariatric   Using straws: no Drinking while eating: no Having you been chewing well:no Chewing/swallowing difficulties: no Changes in vision: no Changes to mood/headaches: no Hair loss/Cahnges to skin/Changes to nails: no Any difficulty focusing or concentrating: no Sweating: no Dizziness/Lightheaded:  Palpitations: no  Carbonated beverages: no N/V/D/C/GAS: 2 bowel movements a week  Abdominal Pain: no Dumping syndrome: no  Recent physical activity:  1.5 hours 3-4 days a week with her daughter:   Nutritional Diagnosis:  [place]-3.3 Overweight/obesity related to past poor dietary habits and physical inactivity as evidenced by patient w/ recent sleeve surgery following dietary guidelines for continued weight loss.    Intervention:  Nutrition counseling for proper diet advancment. Goals: -Have food on your stomach when start the capsule -Add starchy vegetables  -Keep up the great work!  Teaching Method Utilized:  Visual Auditory Hands on  Barriers to  learning/adherence to lifestyle change: none stated  Demonstrated degree of understanding via:  Teach Back   Monitoring/Evaluation:  Dietary intake, exercise, and body weight.

## 2017-11-23 ENCOUNTER — Encounter: Payer: 59 | Admitting: Family Medicine

## 2017-11-28 ENCOUNTER — Encounter: Payer: Self-pay | Admitting: Family Medicine

## 2017-12-11 ENCOUNTER — Encounter: Payer: 59 | Admitting: Family Medicine

## 2018-05-16 ENCOUNTER — Ambulatory Visit: Payer: 59 | Admitting: Family Medicine

## 2019-03-17 ENCOUNTER — Encounter (HOSPITAL_COMMUNITY): Payer: Self-pay

## 2020-02-18 ENCOUNTER — Encounter (HOSPITAL_COMMUNITY): Payer: Self-pay

## 2020-05-02 ENCOUNTER — Encounter: Payer: Self-pay | Admitting: *Deleted

## 2020-05-05 ENCOUNTER — Encounter: Payer: Self-pay | Admitting: Internal Medicine

## 2020-06-28 ENCOUNTER — Encounter: Payer: Self-pay | Admitting: Internal Medicine

## 2021-02-22 ENCOUNTER — Encounter (HOSPITAL_COMMUNITY): Payer: Self-pay | Admitting: *Deleted

## 2022-02-15 ENCOUNTER — Encounter (HOSPITAL_COMMUNITY): Payer: Self-pay | Admitting: *Deleted

## 2023-02-15 ENCOUNTER — Encounter (HOSPITAL_COMMUNITY): Payer: Self-pay | Admitting: *Deleted

## 2023-11-03 ENCOUNTER — Emergency Department (HOSPITAL_COMMUNITY)

## 2023-11-03 ENCOUNTER — Other Ambulatory Visit: Payer: Self-pay

## 2023-11-03 ENCOUNTER — Emergency Department (HOSPITAL_COMMUNITY)
Admission: EM | Admit: 2023-11-03 | Discharge: 2023-11-03 | Disposition: A | Discharge status: Home / Self Care | Attending: Emergency Medicine | Admitting: Emergency Medicine

## 2023-11-03 ENCOUNTER — Encounter (HOSPITAL_COMMUNITY): Payer: Self-pay

## 2023-11-03 DIAGNOSIS — R1084 Generalized abdominal pain: Secondary | ICD-10-CM | POA: Diagnosis not present

## 2023-11-03 DIAGNOSIS — R112 Nausea with vomiting, unspecified: Secondary | ICD-10-CM | POA: Insufficient documentation

## 2023-11-03 DIAGNOSIS — R1030 Lower abdominal pain, unspecified: Secondary | ICD-10-CM

## 2023-11-03 DIAGNOSIS — R197 Diarrhea, unspecified: Secondary | ICD-10-CM | POA: Insufficient documentation

## 2023-11-03 LAB — COMPREHENSIVE METABOLIC PANEL WITH GFR
ALT: 11 U/L (ref 0–44)
AST: 14 U/L — ABNORMAL LOW (ref 15–41)
Albumin: 3.5 g/dL (ref 3.5–5.0)
Alkaline Phosphatase: 58 U/L (ref 38–126)
Anion gap: 6 (ref 5–15)
BUN: 10 mg/dL (ref 6–20)
CO2: 30 mmol/L (ref 22–32)
Calcium: 8.9 mg/dL (ref 8.9–10.3)
Chloride: 108 mmol/L (ref 98–111)
Creatinine, Ser: 0.92 mg/dL (ref 0.44–1.00)
GFR, Estimated: >60 mL/min (ref >60)
Glucose, Bld: 86 mg/dL (ref 70–99)
Potassium: 4 mmol/L (ref 3.5–5.1)
Sodium: 144 mmol/L (ref 135–145)
Total Bilirubin: 0.6 mg/dL (ref 0.0–1.2)
Total Protein: 7 g/dL (ref 6.5–8.1)

## 2023-11-03 LAB — CBC
HCT: 38.9 % (ref 36.0–46.0)
Hemoglobin: 12.4 g/dL (ref 12.0–15.0)
MCH: 27.5 pg (ref 26.0–34.0)
MCHC: 31.9 g/dL (ref 30.0–36.0)
MCV: 86.3 fL (ref 80.0–100.0)
Platelets: 265 10*3/uL (ref 150–400)
RBC: 4.51 MIL/uL (ref 3.87–5.11)
RDW: 13.7 % (ref 11.5–15.5)
WBC: 7.5 10*3/uL (ref 4.0–10.5)
nRBC: 0 % (ref 0.0–0.2)

## 2023-11-03 LAB — LIPASE, BLOOD: Lipase: 51 U/L (ref 11–51)

## 2023-11-03 MED ORDER — SODIUM CHLORIDE 0.9 % IV BOLUS
1000.0000 mL | Freq: Once | INTRAVENOUS | Status: AC
  Administered 2023-11-03: 1000 mL via INTRAVENOUS

## 2023-11-03 MED ORDER — ONDANSETRON 4 MG PO TBDP: 4.0000 mg | ORAL_TABLET | Freq: Three times a day (TID) | ORAL | 0 refills | Status: AC | PRN

## 2023-11-03 MED ORDER — ONDANSETRON 4 MG PO TBDP: 4.0000 mg | ORAL_TABLET | Freq: Once | ORAL | Status: DC | PRN

## 2023-11-03 MED ORDER — HYDROCORTISONE ACETATE 25 MG RE SUPP: 25.0000 mg | Freq: Two times a day (BID) | RECTAL | 0 refills | Status: AC

## 2023-11-03 MED ORDER — IOHEXOL 300 MG/ML  SOLN
100.0000 mL | Freq: Once | INTRAMUSCULAR | Status: AC | PRN
  Administered 2023-11-03: 100 mL via INTRAVENOUS

## 2023-11-03 MED ORDER — DICYCLOMINE HCL 20 MG PO TABS: 20.0000 mg | ORAL_TABLET | Freq: Three times a day (TID) | ORAL | 0 refills | Status: AC | PRN

## 2023-11-03 MED ORDER — ONDANSETRON HCL 4 MG/2ML IJ SOLN
4.0000 mg | Freq: Once | INTRAMUSCULAR | Status: AC
  Administered 2023-11-03: 4 mg via INTRAVENOUS
  Filled 2023-11-03: qty 2

## 2023-11-03 NOTE — ED Provider Notes (Signed)
 [place] EMERGENCY DEPARTMENT AT [facility] Provider Note   CSN: [id number] Arrival date & time: 11/03/23  4098     History  Chief Complaint  Patient presents with   Nausea   Emesis   Diarrhea    Sandra Jenkins is a 53 y.o. female.  The history is provided by the patient.  Emesis Associated symptoms: diarrhea   Diarrhea Associated symptoms: vomiting   Sandra Jenkins is a 53 y.o. female who presents to the Emergency Department complaining of abdominal pain.  She presents to the emergency department for evaluation of abdominal pain and diarrhea.  On Friday she developed significant diarrhea.  On Saturday she had decreased appetite and had recurrent stone but grumbling.  She was able to have a small BM but does have rectal pain as discomfort like she needs to have an additional BM.  No fever but she does have associated nausea.  No vomiting.  No dysuria.  She has no known medical problems.  She does take [facility] for weight loss for the last 6 months.  She has a history of gastric sleeve in 2019.       Home Medications Prior to Admission medications   Medication Sig Start Date End Date Taking? Authorizing Provider  dicyclomine (BENTYL) 20 MG tablet Take 1 tablet (20 mg total) by mouth 3 (three) times daily as needed for spasms. 11/03/23  Yes Kelsey Patricia, MD  hydrocortisone (ANUSOL-HC) 25 MG suppository Place 1 suppository (25 mg total) rectally 2 (two) times daily. 11/03/23  Yes Kelsey Patricia, MD  ondansetron  (ZOFRAN -ODT) 4 MG disintegrating tablet Take 1 tablet (4 mg total) by mouth every 8 (eight) hours as needed. 11/03/23  Yes Kelsey Patricia, MD  Calcium  Carb-Cholecalciferol  (CALCIUM  500+D) 500-400 MG-UNIT TABS Take 1 tablet by mouth 2 (two) times daily. 11/05/17   Smith, Kristi M, MD  fluconazole  (DIFLUCAN ) 150 MG tablet Take 1 tablet (150 mg total) by mouth every 3 (three) days. 11/08/17   Orvel Blanco, MD  Iron -Folic Acid-Vit B12 (IRON  FORMULA)  27-400-100 MG-MCG-MCG CAPS Take 1 capsule by mouth daily. 11/05/17   Valere Gata, MD  Multiple Vitamin (MULTIVITAMIN WITH MINERALS) TABS tablet Take 1 tablet by mouth daily. 11/05/17   Valere Gata, MD  triamcinolone  cream (KENALOG ) 0.1 % Apply 1 application topically 2 (two) times daily. 08/10/17   Smith, Kristi M, MD      Allergies    Patient has no known allergies.    Review of Systems   Review of Systems  Gastrointestinal:  Positive for diarrhea and vomiting.  All other systems reviewed and are negative.   Physical Exam Updated Vital Signs BP 124/87   Pulse 72   Temp 98.6 F (37 C) (Oral)   Resp 12   Ht 5\' 3"  (1.6 m)   Wt 83.9 kg   SpO2 100%   BMI 32.77 kg/m  Physical Exam Vitals and nursing note reviewed.  Constitutional:      Appearance: She is well-developed.  HENT:     Head: Normocephalic and atraumatic.  Cardiovascular:     Rate and Rhythm: Normal rate and regular rhythm.  Pulmonary:     Effort: Pulmonary effort is normal. No respiratory distress.  Abdominal:     Palpations: Abdomen is soft.     Tenderness: There is no guarding or rebound.     Comments: Mild generalized abdominal tenderness  Genitourinary:    Comments: No palpable hemorrhoids on DRE.  No gross blood  on DRE. Musculoskeletal:        General: No tenderness.  Skin:    General: Skin is warm and dry.  Neurological:     Mental Status: She is alert and oriented to person, place, and time.  Psychiatric:        Behavior: Behavior normal.     ED Results / Procedures / Treatments   Labs (all labs ordered are listed, but only abnormal results are displayed) Labs Reviewed  COMPREHENSIVE METABOLIC PANEL WITH GFR - Abnormal; Notable for the following components:      Result Value   AST 14 (*)    All other components within normal limits  LIPASE, BLOOD  CBC  URINALYSIS, ROUTINE W REFLEX MICROSCOPIC    EKG None  Radiology CT ABDOMEN PELVIS W CONTRAST Result Date: 11/03/2023 EXAM: CT  ABDOMEN AND PELVIS WITH CONTRAST 11/03/2023 03:13:06 AM TECHNIQUE: CT of the abdomen and pelvis was performed with intravenous contrast. Multiplanar reformatted images are provided for review. Automated exposure control, iterative reconstruction, and/or weight based adjustment of the mA/kV was utilized to reduce the radiation dose to as low as reasonably achievable. COMPARISON: None available. CLINICAL HISTORY: LLQ abdominal pain. Patient states that she has been unable to eat and has had nausea, vomiting, and diarrhea since Saturday. Attempted to have a bowel movement last night and feels as if something is now blocking her bowel and not allowing her to pass her bowels. Last bowel movement 4/26 at 14:00 (watery diarrhea). Denies fever, chills, or abdominal pain. FINDINGS: LOWER CHEST: No acute abnormality. HEPATOBILIARY: Hepatic cysts measuring up to 2.6 cm in the right hepatic lobe, benign. Gallbladder is unremarkable. No biliary ductal dilatation. SPLEEN: No acute abnormality. PANCREAS: No acute abnormality. ADRENAL GLANDS: No acute abnormality. KIDNEYS, URETERS: No stones in the kidneys or ureters. No evidence of hydronephrosis. No evidence of perinephric or periureteral stranding. GI AND BOWEL: Postsurgical changes related to gastric sleeve. Normal appendix (image 51). Stomach demonstrates no acute abnormality. There is no evidence of bowel obstruction. No evidence of appendicitis. PELVIS: The uterus is notable for an IUD in a satisfactory position. Urinary bladder is unremarkable. PERITONEUM AND RETROPERITONEUM: No evidence of free fluid or free air. LYMPH NODES: No evidence of lymphadenopathy. BONES AND SOFT TISSUES: Mild degenerative changes at T11-12. No acute osseous abnormality. No focal soft tissue abnormality. Incidental adrenal and/or renal findings do not require follow up imaging. IMPRESSION: 1. No CT findings to account for the patient's LLQ abdominal pain. 2. Additional ancillary findings, as  above. Electronically signed by: Zadie Herter MD 11/03/2023 03:26 AM EDT RP Workstation: [id number]    Procedures Procedures    Medications Ordered in ED Medications  ondansetron  (ZOFRAN -ODT) disintegrating tablet 4 mg (has no administration in time range)  ondansetron  (ZOFRAN ) injection 4 mg (4 mg Intravenous Given 11/03/23 0235)  sodium chloride  0.9 % bolus 1,000 mL (0 mLs Intravenous Stopped 11/03/23 0417)  iohexol (OMNIPAQUE) 300 MG/ML solution 100 mL (100 mLs Intravenous Contrast Given 11/03/23 0254)    ED Course/ Medical Decision Making/ A&P                                 Medical Decision Making Amount and/or Complexity of Data Reviewed Labs: ordered. Radiology: ordered.  Risk Prescription drug management.   Patient here for evaluation of abdominal pain, diarrhea and some rectal discomfort sensation that she is unable to empty her bowels.  She is mild tenderness on  examination on her abdominal wall.  No significant rectal tenderness or mass.  Labs are reassuring.  CT abdomen pelvis without any evidence of diverticulitis.  Suspect that she has rectal irritation secondary to recent large-volume diarrhea.  Will prescribe Anusol HC for her symptoms.  No recurrent vomiting in the emergency department.  Will prescribe ondansetron  for nausea.  Will prescribe Bentyl for abdominal cramping.  Discussed with patient PCP follow-up for further evaluation as well as return precautions for progressive or new concerning symptoms.        Final Clinical Impression(s) / ED Diagnoses Final diagnoses:  Diarrhea, unspecified type  Lower abdominal pain    Rx / DC Orders ED Discharge Orders          Ordered    dicyclomine (BENTYL) 20 MG tablet  3 times daily PRN        11/03/23 0406    hydrocortisone (ANUSOL-HC) 25 MG suppository  2 times daily        11/03/23 0406    ondansetron  (ZOFRAN -ODT) 4 MG disintegrating tablet  Every 8 hours PRN        11/03/23 0408               Kelsey Patricia, MD 11/03/23 [phone]

## 2023-11-03 NOTE — ED Triage Notes (Signed)
 Pt states that she's been unable to eat and having n/v/d since Saturday. Attempted to have bowel movement last night and feels as if something is now blocking her bowel and not allowing her to pass her bowels. LBM 4/26@1400 (watery diarrhea). Denies fever,chills, or abdominal pain.

## 2024-02-21 ENCOUNTER — Encounter (HOSPITAL_COMMUNITY): Payer: Self-pay | Admitting: *Deleted
